# Patient Record
Sex: Female | Born: 1995 | Hispanic: Yes | Marital: Married | Smoking: Never smoker
Health system: Southern US, Community
[De-identification: ages and names within clinical notes are randomized; demographics above are authoritative.]

## PROBLEM LIST (undated history)

## (undated) ENCOUNTER — Inpatient Hospital Stay: Payer: Self-pay

## (undated) DIAGNOSIS — Z789 Other specified health status: Secondary | ICD-10-CM

## (undated) HISTORY — PX: NO PAST SURGERIES: SHX2092

---

## 2014-07-28 ENCOUNTER — Emergency Department: Payer: Self-pay | Admitting: Emergency Medicine

## 2014-07-28 LAB — URINALYSIS, COMPLETE
BILIRUBIN, UR: NEGATIVE
GLUCOSE, UR: NEGATIVE mg/dL (ref 0–75)
KETONE: NEGATIVE
NITRITE: NEGATIVE
Ph: 7 (ref 4.5–8.0)
Protein: 30
Specific Gravity: 1.013 (ref 1.003–1.030)
Squamous Epithelial: 2
WBC UR: 303 /HPF (ref 0–5)

## 2014-07-28 LAB — PREGNANCY, URINE: Pregnancy Test, Urine: NEGATIVE m[IU]/mL

## 2016-01-29 ENCOUNTER — Encounter: Payer: Self-pay | Admitting: Emergency Medicine

## 2016-01-29 ENCOUNTER — Emergency Department
Admission: EM | Admit: 2016-01-29 | Discharge: 2016-01-29 | Disposition: A | Discharge status: Home / Self Care | Payer: Managed Care, Other (non HMO) | Attending: Emergency Medicine | Admitting: Emergency Medicine

## 2016-01-29 DIAGNOSIS — B349 Viral infection, unspecified: Secondary | ICD-10-CM | POA: Insufficient documentation

## 2016-01-29 DIAGNOSIS — J029 Acute pharyngitis, unspecified: Secondary | ICD-10-CM | POA: Diagnosis present

## 2016-01-29 LAB — POCT RAPID STREP A: Streptococcus, Group A Screen (Direct): NEGATIVE

## 2016-01-29 NOTE — ED Provider Notes (Signed)
[facility] Emergency Department Provider Note  ____________________________________________  Time seen: Approximately 6:05 AM  I have reviewed the triage vital signs and the nursing notes.   HISTORY  Chief Complaint Sore Throat; Generalized Body Aches; and Nasal Congestion    HPI Diane Morse is a 20 y.o. female with no significant past medical history who presents with gradual onset of symptoms for 3 days that include a mild cough, sore throat, generalized body aches, nasal congestion, and full and painful ears bilaterally.  She reports that she has been taking ibuprofen but that nothing makes it better.  It is not necessarily getting worse over time, just staying the same.  She is able to eat and drink without difficulty, swallow with mild to moderate pain, and is speaking without any change in voice.  She has felt a subjective fever and occasional chills.  She denies chest pain, shortness of breath, nausea, vomiting, abdominal pain, diarrhea, dysuria.   History reviewed. No pertinent past medical history.  There are no active problems to display for this patient.   History reviewed. No pertinent past surgical history.  No current outpatient prescriptions on file.  Allergies Review of patient's allergies indicates no known allergies.  History reviewed. No pertinent family history.  Social History Social History  Substance Use Topics  . Smoking status: Never Smoker   . Smokeless tobacco: None  . Alcohol Use: No    Review of Systems Constitutional: Subjective fever/chills.  Generalized body aches Eyes: No visual changes. ENT: +sore throat. Cardiovascular: Denies chest pain. Respiratory: Denies shortness of breath. Gastrointestinal: No abdominal pain.  No nausea, no vomiting.  No diarrhea.  No constipation. Genitourinary: Negative for dysuria. Musculoskeletal: Negative for back pain. Skin: Negative for rash. Neurological: Negative for  headaches, focal weakness or numbness.  10-point ROS otherwise negative.  ____________________________________________   PHYSICAL EXAM:  VITAL SIGNS: ED Triage Vitals  Enc Vitals Group     BP 01/29/16 0223 121/82 mmHg     Pulse Rate 01/29/16 0223 83     Resp 01/29/16 0223 18     Temp 01/29/16 0223 98.2 F (36.8 C)     Temp Source 01/29/16 0223 Oral     SpO2 01/29/16 0223 99 %     Weight 01/29/16 0223 121 lb 1 oz (54.914 kg)     Height 01/29/16 0223  (1.575 m)     Head Cir --      Peak Flow --      Pain Score 01/29/16 0224 8     Pain Loc --      Pain Edu? --      Excl. in GC? --     Constitutional: Alert and oriented. Well appearing and in no acute distress. Multiple voice. Eyes: Conjunctivae are normal. PERRL. EOMI. Head: Atraumatic. Nose: +congestion/rhinnorhea. Mouth/Throat: Mucous membranes are moist.  Sclera oropharynx is erythematous but without petechiae and no exudate on the tonsils.  Her tonsils are not swollen.  There is no sign of intraoral or pharyngeal abscess.  No hoarse nor "hot potato" voice. Neck: No stridor.  No meningeal signs.  Mild cervical lymphadenopathy with some tenderness.  No tenderness with manipulation of the larynx. Cardiovascular: Normal rate, regular rhythm. Good peripheral circulation. Grossly normal heart sounds.   Respiratory: Normal respiratory effort.  No retractions. Lungs CTAB. Gastrointestinal: Soft and nontender. No distention.  Musculoskeletal: No lower extremity tenderness nor edema. No gross deformities of extremities. Neurologic:  Normal speech and language. No gross  focal neurologic deficits are appreciated.  Skin:  Skin is warm, dry and intact. No rash noted. Psychiatric: Mood and affect are normal. Speech and behavior are normal.  ____________________________________________   LABS (all labs ordered are listed, but only abnormal results are displayed)  Labs Reviewed  POCT RAPID STREP A    ____________________________________________  EKG  None ____________________________________________  RADIOLOGY   No results found.  ____________________________________________   PROCEDURES  Procedure(s) performed: None  Critical Care performed: No ____________________________________________   INITIAL IMPRESSION / ASSESSMENT AND PLAN / ED COURSE  Pertinent labs & imaging results that were available during my care of the patient were reviewed by me and considered in my medical decision making (see chart for details).  Signs/sxs suggest viral illness.   Nothing to suggest serious bacterial infection.  Gave usual/customary recommendations and return precautions.   ____________________________________________  FINAL CLINICAL IMPRESSION(S) / ED DIAGNOSES  Final diagnoses:  Viral syndrome     MEDICATIONS GIVEN DURING THIS VISIT:  Medications - No data to display   NEW OUTPATIENT MEDICATIONS STARTED DURING THIS VISIT:  New Prescriptions   No medications on file      Note:  This document was prepared using Dragon voice recognition software and may include unintentional dictation errors.   Loleta Roseory Nevah Dalal, MD 01/29/16 [phone]

## 2016-01-29 NOTE — ED Notes (Signed)
Pt reports cough, sore throat and general body aches for 3 days; taking Ibuprofen with no relief; pt talking in complete coherent sentences; in no acute distress

## 2016-01-29 NOTE — Discharge Instructions (Signed)

## 2018-02-13 ENCOUNTER — Encounter: Payer: Self-pay | Admitting: Emergency Medicine

## 2018-02-13 ENCOUNTER — Emergency Department: Payer: Managed Care, Other (non HMO)

## 2018-02-13 ENCOUNTER — Other Ambulatory Visit: Payer: Self-pay

## 2018-02-13 ENCOUNTER — Emergency Department
Admission: EM | Admit: 2018-02-13 | Discharge: 2018-02-13 | Disposition: A | Discharge status: Home / Self Care | Payer: Managed Care, Other (non HMO) | Attending: Emergency Medicine | Admitting: Emergency Medicine

## 2018-02-13 DIAGNOSIS — N12 Tubulo-interstitial nephritis, not specified as acute or chronic: Secondary | ICD-10-CM | POA: Diagnosis not present

## 2018-02-13 DIAGNOSIS — R1031 Right lower quadrant pain: Secondary | ICD-10-CM | POA: Diagnosis present

## 2018-02-13 DIAGNOSIS — N390 Urinary tract infection, site not specified: Secondary | ICD-10-CM | POA: Diagnosis not present

## 2018-02-13 LAB — URINALYSIS, COMPLETE (UACMP) WITH MICROSCOPIC
Bacteria, UA: NONE SEEN
Bilirubin Urine: NEGATIVE
GLUCOSE, UA: NEGATIVE mg/dL
Ketones, ur: NEGATIVE mg/dL
Nitrite: NEGATIVE
PH: 7 (ref 5.0–8.0)
Protein, ur: 30 mg/dL — AB
SPECIFIC GRAVITY, URINE: 1.011 (ref 1.005–1.030)
WBC, UA: >50 WBC/hpf — ABNORMAL HIGH (ref 0–5)

## 2018-02-13 LAB — CBC
HCT: 37.5 % (ref 35.0–47.0)
Hemoglobin: 12.8 g/dL (ref 12.0–16.0)
MCH: 28.9 pg (ref 26.0–34.0)
MCHC: 34.1 g/dL (ref 32.0–36.0)
MCV: 84.7 fL (ref 80.0–100.0)
PLATELETS: 262 10*3/uL (ref 150–440)
RBC: 4.43 MIL/uL (ref 3.80–5.20)
RDW: 11.9 % (ref 11.5–14.5)
WBC: 12.1 10*3/uL — AB (ref 3.6–11.0)

## 2018-02-13 LAB — COMPREHENSIVE METABOLIC PANEL
ALK PHOS: 89 U/L (ref 38–126)
ALT: 26 U/L (ref 14–54)
AST: 32 U/L (ref 15–41)
Albumin: 4.1 g/dL (ref 3.5–5.0)
Anion gap: 7 (ref 5–15)
BUN: 9 mg/dL (ref 6–20)
CALCIUM: 8.8 mg/dL — AB (ref 8.9–10.3)
CO2: 24 mmol/L (ref 22–32)
CREATININE: 0.64 mg/dL (ref 0.44–1.00)
Chloride: 105 mmol/L (ref 101–111)
Glucose, Bld: 121 mg/dL — ABNORMAL HIGH (ref 65–99)
Potassium: 3.3 mmol/L — ABNORMAL LOW (ref 3.5–5.1)
Sodium: 136 mmol/L (ref 135–145)
Total Bilirubin: 0.3 mg/dL (ref 0.3–1.2)
Total Protein: 7.2 g/dL (ref 6.5–8.1)

## 2018-02-13 LAB — POCT PREGNANCY, URINE: Preg Test, Ur: NEGATIVE

## 2018-02-13 LAB — LIPASE, BLOOD: LIPASE: 40 U/L (ref 11–51)

## 2018-02-13 MED ORDER — SODIUM CHLORIDE 0.9 % IV BOLUS
1000.0000 mL | Freq: Once | INTRAVENOUS | Status: AC
  Administered 2018-02-13: 1000 mL via INTRAVENOUS

## 2018-02-13 MED ORDER — ONDANSETRON 4 MG PO TBDP: 4.0000 mg | ORAL_TABLET | Freq: Three times a day (TID) | ORAL | 0 refills | Status: DC | PRN

## 2018-02-13 MED ORDER — ONDANSETRON HCL 4 MG/2ML IJ SOLN
4.0000 mg | Freq: Once | INTRAMUSCULAR | Status: AC
  Administered 2018-02-13: 4 mg via INTRAVENOUS
  Filled 2018-02-13: qty 2

## 2018-02-13 MED ORDER — HYDROCODONE-ACETAMINOPHEN 5-325 MG PO TABS: 1.0000 | ORAL_TABLET | Freq: Four times a day (QID) | ORAL | 0 refills | Status: DC | PRN

## 2018-02-13 MED ORDER — KETOROLAC TROMETHAMINE 30 MG/ML IJ SOLN
10.0000 mg | Freq: Once | INTRAMUSCULAR | Status: AC
  Administered 2018-02-13: 9.9 mg via INTRAVENOUS
  Filled 2018-02-13: qty 1

## 2018-02-13 MED ORDER — IBUPROFEN 600 MG PO TABS: 600.0000 mg | ORAL_TABLET | Freq: Three times a day (TID) | ORAL | 0 refills | Status: DC | PRN

## 2018-02-13 MED ORDER — SODIUM CHLORIDE 0.9 % IV SOLN
1.0000 g | Freq: Once | INTRAVENOUS | Status: AC
  Administered 2018-02-13: 1 g via INTRAVENOUS
  Filled 2018-02-13: qty 10

## 2018-02-13 MED ORDER — CEPHALEXIN 500 MG PO CAPS: 500.0000 mg | ORAL_CAPSULE | Freq: Three times a day (TID) | ORAL | 0 refills | Status: DC

## 2018-02-13 NOTE — ED Triage Notes (Signed)
Patient ambulatory to triage with steady gait, without difficulty or distress noted; pt reports right sided abd pain radiating into back today accomp by dysuria

## 2018-02-13 NOTE — Discharge Instructions (Addendum)
1.  Take antibiotic as prescribed (Keflex 500 mg 3 times daily for 7 days). 2.  You may take medicines as needed for pain or nausea (Motrin/Norco/Zofran #15). 3.  Return to the ER for worsening symptoms, persistent vomiting, fever or other concerns.

## 2018-02-13 NOTE — ED Provider Notes (Signed)
[facility] Emergency Department Provider Note   ____________________________________________   First MD Initiated Contact with Patient 02/13/18 0136     (approximate)  I have reviewed the triage vital signs and the nursing notes.   HISTORY  Chief Complaint Abdominal Pain    HPI Diane Morse is a 22 y.o. female who presents to the ED from home with a chief complaint of dysuria, right-sided abdominal pain radiating into her back.  Symptoms ongoing for the past 3 days.  Intermittent nausea.  She was in [place] and unable to get any medications.  [place] yesterday and started taking AZO which has improved her dysuria.  Denies associated fever, chills, chest pain, shortness of breath, vomiting, diarrhea.   Past medical history None  There are no active problems to display for this patient.   History reviewed. No pertinent surgical history.  Prior to Admission medications   Not on File    Allergies Patient has no known allergies.  No family history on file.  Social History Social History   Tobacco Use  . Smoking status: Never Smoker  . Smokeless tobacco: Never Used  Substance Use Topics  . Alcohol use: No  . Drug use: No    Review of Systems  Constitutional: No fever/chills Eyes: No visual changes. ENT: No sore throat. Cardiovascular: Denies chest pain. Respiratory: Denies shortness of breath. Gastrointestinal: Positive for abdominal pain.  Positive for nausea, no vomiting.  No diarrhea.  No constipation. Genitourinary: Positive for dysuria. Musculoskeletal: Positive for back pain. Skin: Negative for rash. Neurological: Negative for headaches, focal weakness or numbness.   ____________________________________________   PHYSICAL EXAM:  VITAL SIGNS: ED Triage Vitals  Enc Vitals Group     BP 02/13/18 0020 116/78     Pulse Rate 02/13/18 0020 (!) 104     Resp 02/13/18 0020 (!) 22     Temp 02/13/18 0020  98.8 F (37.1 C)     Temp Source 02/13/18 0020 Oral     SpO2 02/13/18 0020 100 %     Weight 02/13/18 0002 125 lb (56.7 kg)     Height 02/13/18 0002 5\' 2"  (1.575 m)     Head Circumference --      Peak Flow --      Pain Score 02/13/18 0002 10     Pain Loc --      Pain Edu? --      Excl. in GC? --     Constitutional: Alert and oriented. Well appearing and in mild acute distress. Eyes: Conjunctivae are normal. PERRL. EOMI. Head: Atraumatic. Nose: No congestion/rhinnorhea. Mouth/Throat: Mucous membranes are moist.  Oropharynx non-erythematous. Neck: No stridor.   Cardiovascular: Normal rate, regular rhythm. Grossly normal heart sounds.  Good peripheral circulation. Respiratory: Normal respiratory effort.  No retractions. Lungs CTAB. Gastrointestinal: Soft and mildly tender to palpation suprapubic area without rebound or guarding. No distention. No abdominal bruits.  Mild right CVA tenderness. Musculoskeletal: No lower extremity tenderness nor edema.  No joint effusions. Neurologic:  Normal speech and language. No gross focal neurologic deficits are appreciated. No gait instability. Skin:  Skin is warm, dry and intact. No rash noted. Psychiatric: Mood and affect are normal. Speech and behavior are normal.  ____________________________________________   LABS (all labs ordered are listed, but only abnormal results are displayed)  Labs Reviewed  COMPREHENSIVE METABOLIC PANEL - Abnormal; Notable for the following components:      Result Value   Potassium 3.3 (*)  Glucose, Bld 121 (*)    Calcium 8.8 (*)    All other components within normal limits  CBC - Abnormal; Notable for the following components:   WBC 12.1 (*)    All other components within normal limits  URINALYSIS, COMPLETE (UACMP) WITH MICROSCOPIC - Abnormal; Notable for the following components:   Color, Urine YELLOW (*)    APPearance HAZY (*)    Hgb urine dipstick MODERATE (*)    Protein, ur 30 (*)    Leukocytes, UA  MODERATE (*)    WBC, UA >50 (*)    All other components within normal limits  URINE CULTURE  LIPASE, BLOOD  POC URINE PREG, ED  POCT PREGNANCY, URINE   ____________________________________________  EKG  None ____________________________________________  RADIOLOGY  ED MD interpretation: Urethritis, pyelonephritis; no obstructing stones  Official radiology report(s): Ct Renal Stone Study  Result Date: 02/13/2018 CLINICAL DATA:  Acute onset of right-sided abdominal pain, radiating to the back. Dysuria. EXAM: CT ABDOMEN AND PELVIS WITHOUT CONTRAST TECHNIQUE: Multidetector CT imaging of the abdomen and pelvis was performed following the standard protocol without IV contrast. COMPARISON:  None. FINDINGS: Lower chest: The visualized lung bases are grossly clear. The visualized portions of the mediastinum are unremarkable. Hepatobiliary: The liver is unremarkable in appearance. The gallbladder is unremarkable in appearance. The common bile duct remains normal in caliber. Pancreas: The pancreas is within normal limits. Spleen: The spleen is unremarkable in appearance. Adrenals/Urinary Tract: The adrenal glands are unremarkable in appearance. Minimal haziness is noted about the right renal pelvis and right ureter, concerning for right-sided ureteritis and pyelonephritis. There is no evidence of hydronephrosis. No renal or ureteral stones are identified. No perinephric stranding is seen. Stomach/Bowel: The stomach is unremarkable in appearance. The small bowel is within normal limits. The appendix is normal in caliber, without evidence of appendicitis. The colon is unremarkable in appearance. Vascular/Lymphatic: The abdominal aorta is unremarkable in appearance. The inferior vena cava is grossly unremarkable. No retroperitoneal lymphadenopathy is seen. No pelvic sidewall lymphadenopathy is identified. Reproductive: The bladder is thick-walled, concerning for cystitis. The uterus is grossly unremarkable.  The ovaries are relatively symmetric. No suspicious adnexal masses are seen. Other: No additional soft tissue abnormalities are seen. Musculoskeletal: No acute osseous abnormalities are identified. The visualized musculature is unremarkable in appearance. IMPRESSION: 1. Minimal haziness about the right renal pelvis and right ureter, concerning for right-sided ureteritis and pyelonephritis. 2. Thick-walled bladder, concerning for cystitis. Electronically Signed   By: Roanna Raider M.D.   On: 02/13/2018 02:11    ____________________________________________   PROCEDURES  Procedure(s) performed: None  Procedures  Critical Care performed: No  ____________________________________________   INITIAL IMPRESSION / ASSESSMENT AND PLAN / ED COURSE  As part of my medical decision making, I reviewed the following data within the electronic MEDICAL RECORD NUMBER History obtained from family, Nursing notes reviewed and incorporated, Labs reviewed, Radiograph reviewed and Notes from prior ED visits   22 year old female who presents with dysuria, abdominal and flank pain. Differential diagnosis includes, but is not limited to, ovarian cyst, ovarian torsion, acute appendicitis, diverticulitis, urinary tract infection/pyelonephritis, endometriosis, bowel obstruction, colitis, renal colic, gastroenteritis, hernia, fibroids, endometriosis, pregnancy related pain including ectopic pregnancy, etc.   Patient is afebrile, hemodynamically stable, with mild leukocytosis and urinalysis consistent with UTI.  Very low suspicion for sepsis.  Clinically patient's symptoms are consistent with pyelonephritis.  Although she has no personal history of kidney stones, will obtain CT scan to evaluate obstructive uropathy.  Will initiate IV  fluid resuscitation, 10 mg IV Toradol for pain, 4 mg IV Zofran for nausea.  Start 1 g IV Rocephin and add urine culture.      ____________________________________________   FINAL CLINICAL  IMPRESSION(S) / ED DIAGNOSES  Final diagnoses:  Lower urinary tract infectious disease  Pyelonephritis     ED Discharge Orders    None       Note:  This document was prepared using Dragon voice recognition software and may include unintentional dictation errors.    Irean HongSung, Jade J, MD 02/13/18 [phone]

## 2018-02-15 LAB — URINE CULTURE: Culture: >=100000 — AB

## 2019-07-22 IMAGING — CT CT RENAL STONE PROTOCOL
3 of 4 series · 7 of 46 positions shown, 13 images · non-contrast
Comparison: None.

CLINICAL DATA: Acute onset of right-sided abdominal pain, radiating
to the back. Dysuria.

EXAM:
CT ABDOMEN AND PELVIS WITHOUT CONTRAST
TECHNIQUE: Multidetector CT imaging of the abdomen and pelvis was performed
following the standard protocol without IV contrast.

[Series 4: lung bases · axial · 0.63mm/px · z∈[-382,-342]mm · 3 of 17 slices shown, 7 images]
[im 5/17  soft-tissue]
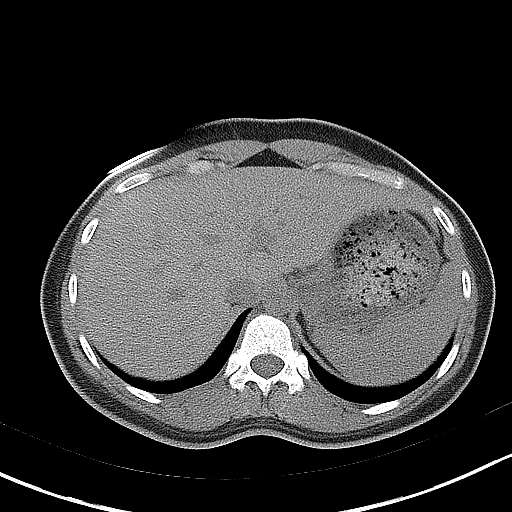
[im 5/17  lung]
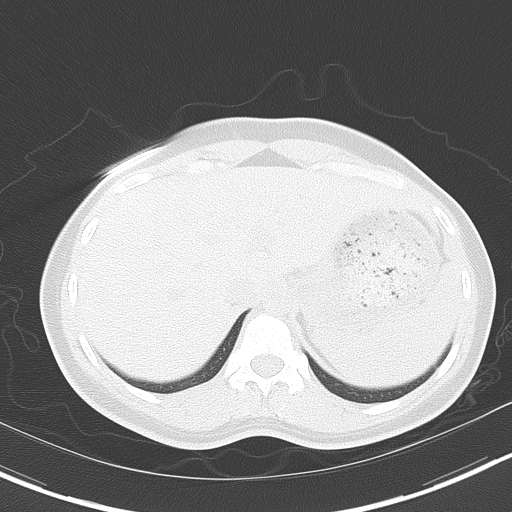
[im 5/17  bone]
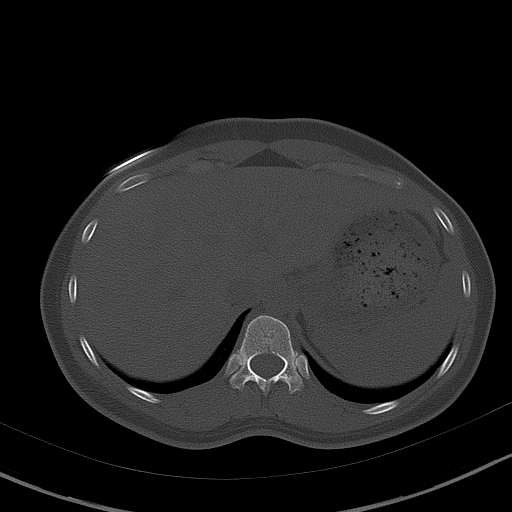
[im 9/17  soft-tissue]
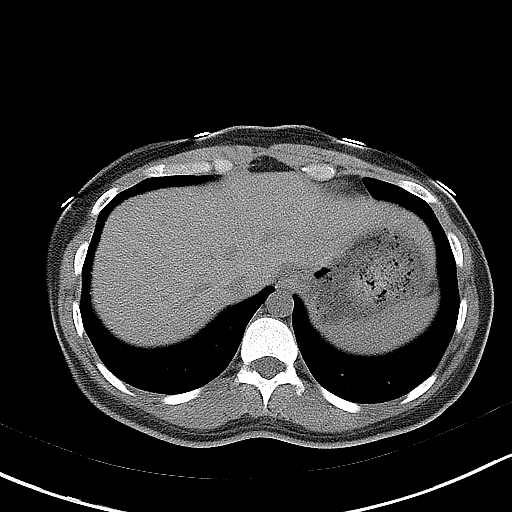
[im 9/17  lung]
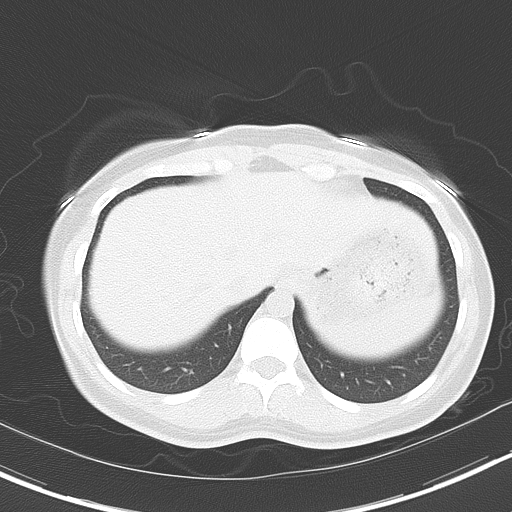
[im 13/17  soft-tissue]
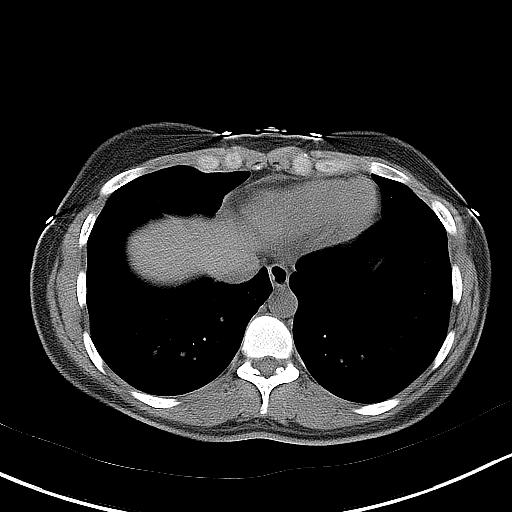
[im 13/17  lung]
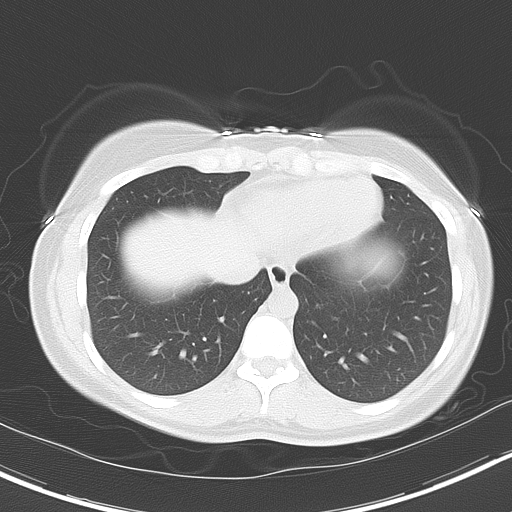

[Series 5: coronal · coronal · 0.62mm/px · 3 of 107 slices shown, 4 images]
[im 36/107  soft-tissue]
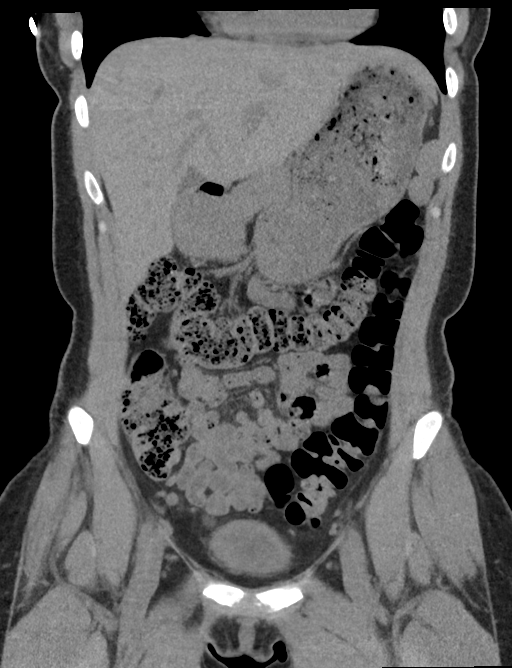
[im 48/107  soft-tissue]
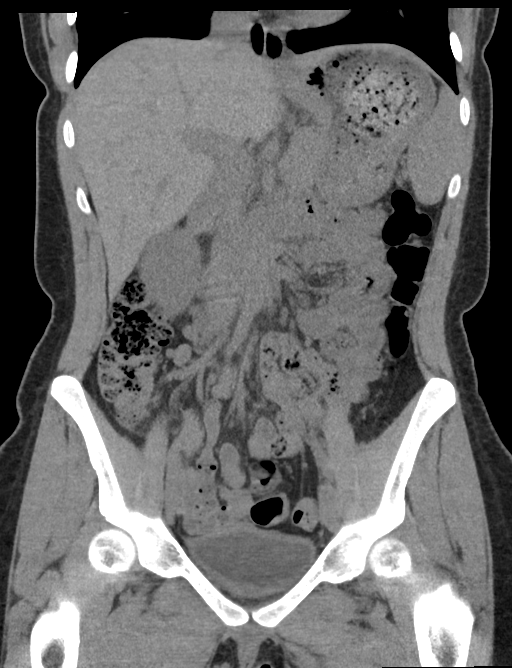
[im 48/107  bone]
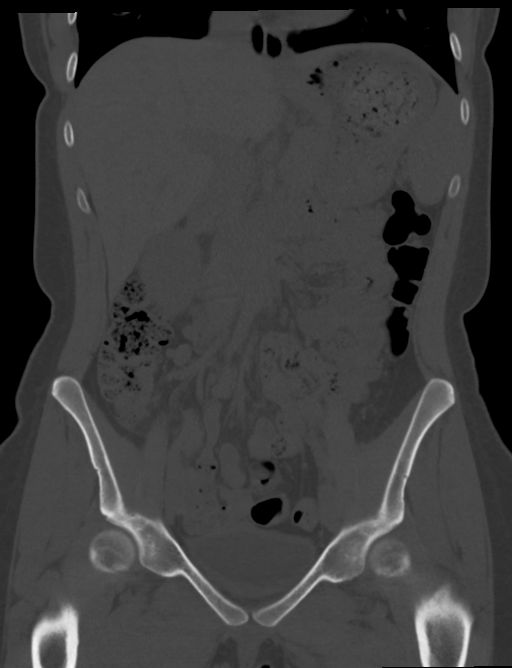
[im 59/107  soft-tissue]
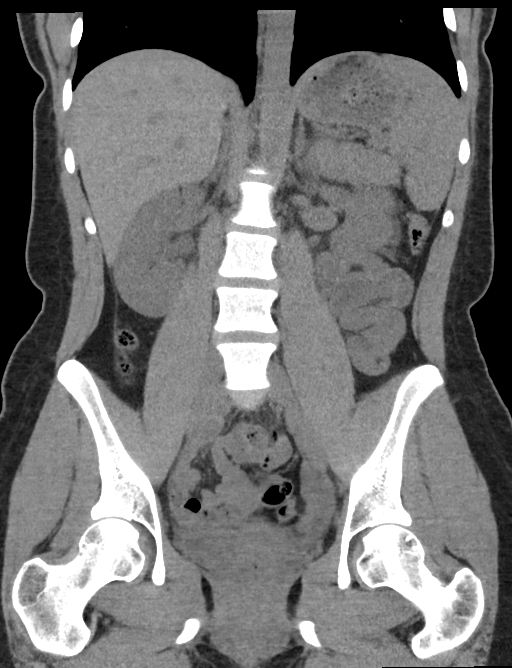

[Series 6: sagittal · sagittal · 0.48mm/px · 1 of 155 slices shown, 2 images]
[im 52/155  soft-tissue]
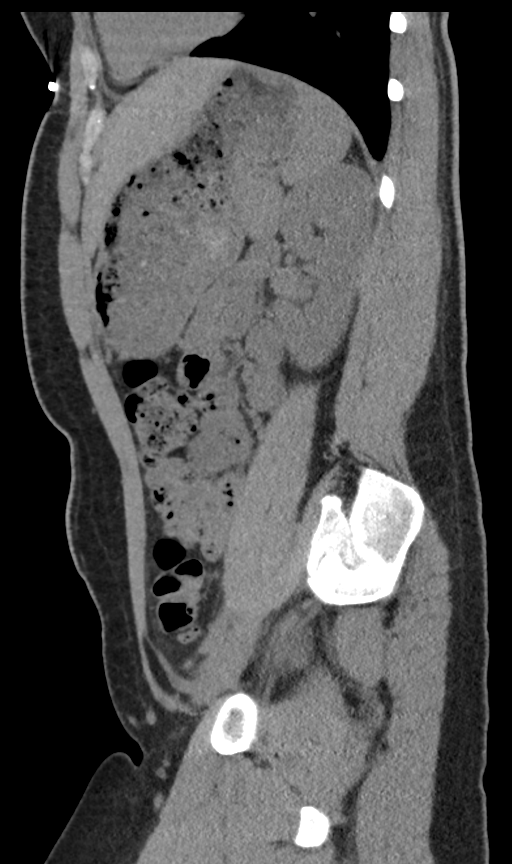
[im 52/155  bone]
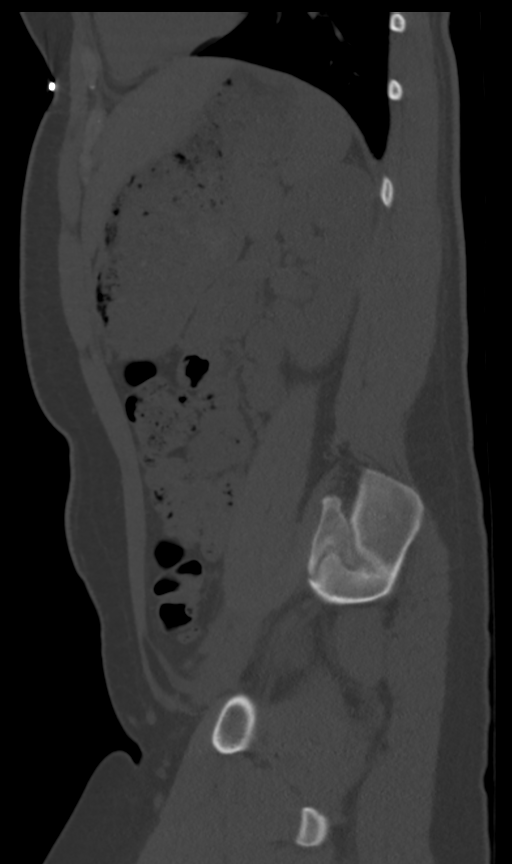

[7 of 46 positions shown; findings below may reference images not displayed]

FINDINGS: Lower chest: The visualized lung bases are grossly clear. The
visualized portions of the mediastinum are unremarkable.

Hepatobiliary: The liver is unremarkable in appearance. The
gallbladder is unremarkable in appearance. The common bile duct
remains normal in caliber.

Pancreas: The pancreas is within normal limits.

Spleen: The spleen is unremarkable in appearance.

Adrenals/Urinary Tract: The adrenal glands are unremarkable in
appearance.

Minimal haziness is noted about the right renal pelvis and right
ureter, concerning for right-sided ureteritis and pyelonephritis.
There is no evidence of hydronephrosis. No renal or ureteral stones
are identified. No perinephric stranding is seen.

Stomach/Bowel: The stomach is unremarkable in appearance. The small
bowel is within normal limits. The appendix is normal in caliber,
without evidence of appendicitis. The colon is unremarkable in
appearance.

Vascular/Lymphatic: The abdominal aorta is unremarkable in
appearance. The inferior vena cava is grossly unremarkable. No
retroperitoneal lymphadenopathy is seen. No pelvic sidewall
lymphadenopathy is identified.

Reproductive: The bladder is thick-walled, concerning for cystitis.
The uterus is grossly unremarkable. The ovaries are relatively
symmetric. No suspicious adnexal masses are seen.

Other: No additional soft tissue abnormalities are seen.

Musculoskeletal: No acute osseous abnormalities are identified. The
visualized musculature is unremarkable in appearance.
IMPRESSION: 1. Minimal haziness about the right renal pelvis and right ureter,
concerning for right-sided ureteritis and pyelonephritis.
2. Thick-walled bladder, concerning for cystitis.

## 2019-12-06 ENCOUNTER — Ambulatory Visit: Payer: Self-pay | Attending: Internal Medicine

## 2019-12-06 DIAGNOSIS — Z23 Encounter for immunization: Secondary | ICD-10-CM

## 2019-12-06 NOTE — Progress Notes (Signed)
   Covid-19 Vaccination Clinic  Name:  Kayron Kalmar    MRN: [id number] DOB: Feb 03, 1996  12/06/2019  Ms. Stuck was observed post Covid-19 immunization for 15 minutes without incident. She was provided with Vaccine Information Sheet and instruction to access the V-Safe system.   Ms. Romanek was instructed to call 911 with any severe reactions post vaccine: Marland Kitchen Difficulty breathing  . Swelling of face and throat  . A fast heartbeat  . A bad rash all over body  . Dizziness and weakness   Immunizations Administered    Name Date Dose VIS Date Route   Pfizer COVID-19 Vaccine 12/06/2019  7:09 PM 0.3 mL 08/21/2019 Intramuscular   Manufacturer: [employer], [employer]   Lot: [id number]   NDC: [address]-0

## 2019-12-27 ENCOUNTER — Ambulatory Visit: Payer: Medicaid Other | Attending: Internal Medicine

## 2019-12-27 DIAGNOSIS — Z23 Encounter for immunization: Secondary | ICD-10-CM

## 2019-12-27 NOTE — Progress Notes (Signed)
   Covid-19 Vaccination Clinic  Name:  Dao Mearns    MRN: [id number] DOB: 1996-04-08  12/27/2019  Ms. Boston was observed post Covid-19 immunization for 15 minutes without incident. She was provided with Vaccine Information Sheet and instruction to access the V-Safe system.   Ms. Newby was instructed to call 911 with any severe reactions post vaccine: Marland Kitchen Difficulty breathing  . Swelling of face and throat  . A fast heartbeat  . A bad rash all over body  . Dizziness and weakness   Immunizations Administered    Name Date Dose VIS Date Route   Pfizer COVID-19 Vaccine 12/27/2019  6:48 PM 0.3 mL 11/04/2018 Intramuscular   Manufacturer: [employer], [employer]   Lot: [id number]   NDC: [address]-8

## 2020-08-15 ENCOUNTER — Other Ambulatory Visit: Payer: Self-pay

## 2020-08-15 ENCOUNTER — Emergency Department
Admission: EM | Admit: 2020-08-15 | Discharge: 2020-08-15 | Disposition: A | Discharge status: Home / Self Care | Payer: Medicaid Other | Attending: Emergency Medicine | Admitting: Emergency Medicine

## 2020-08-15 DIAGNOSIS — Z3A01 Less than 8 weeks gestation of pregnancy: Secondary | ICD-10-CM | POA: Insufficient documentation

## 2020-08-15 DIAGNOSIS — O26891 Other specified pregnancy related conditions, first trimester: Secondary | ICD-10-CM | POA: Insufficient documentation

## 2020-08-15 DIAGNOSIS — Z5321 Procedure and treatment not carried out due to patient leaving prior to being seen by health care provider: Secondary | ICD-10-CM | POA: Insufficient documentation

## 2020-08-15 DIAGNOSIS — R1030 Lower abdominal pain, unspecified: Secondary | ICD-10-CM | POA: Insufficient documentation

## 2020-08-15 NOTE — ED Triage Notes (Signed)
Pt here with lower groin pain that started last night. Pain starts in the front and travels around to her back. Pt is currently [redacted] weeks pregnant with her first child. Pt NAD in triage.

## 2020-08-15 NOTE — ED Notes (Signed)
Attempted to get vitals.  Pt does not want to wait any longer and will go to another emergency dept.  Pt ambulated out the door

## 2020-08-16 LAB — OB RESULTS CONSOLE GC/CHLAMYDIA
Chlamydia: NEGATIVE
Gonorrhea: NEGATIVE

## 2020-09-10 NOTE — L&D Delivery Note (Signed)
Pt transported to nursery at this time.

## 2020-09-10 NOTE — L&D Delivery Note (Signed)
This Rn brought breast pump and set to pt in order to let pt pump. Pt asked if she would like to pump at this time and pt stated "not right now". Pt preparing to rest at this time.

## 2020-09-10 NOTE — L&D Delivery Note (Signed)
Delivery Note  First Stage: Labor onset: 1802 Induction: Cytotec x 5 doses (starting 02/16/21 at 0046), Cook Catheter, Pitocin and AROM Analgesia /Anesthesia intrapartum: epidural AROM at 1802  Second Stage: Complete dilation at 2122 Onset of pushing at 2127 FHR second stage Cat II- min-mod variability  Delivery of a viable female infant on 02/17/21 at 2141 by CNM delivery of fetal head in LOA position with restitution to LOT. No nuchal cord;  Anterior then posterior shoulders delivered easily with gentle downward traction. Baby vigorous at perineum, cord double clamped and cut by CNM and handed off to waiting NICU team. Cord blood sample collected    Third Stage: Placenta delivered spontaneously intact with [facility] @ 2145 Placenta disposition: to pathology due to severe pre-E at 34.4wks Uterine tone firmed with massage/ bleeding small  1st deg perineal and left sidewall laceration identified  Anesthesia for repair: epidural Repair 2-0 Vicryl CT-1 in usual fashion to achieve hemostasis.  Est. Blood Loss (mL): 305  Complications: none  Mom to  remain in L&D on Magnesium for 24hrs PP .  Baby to Couplet care / Skin to Skin.  Newborn: Birth Weight: 4#9  Apgar Scores: 8/9 Feeding planned: breast

## 2020-10-07 LAB — OB RESULTS CONSOLE VARICELLA ZOSTER ANTIBODY, IGG: Varicella: IMMUNE

## 2020-10-07 LAB — OB RESULTS CONSOLE HIV ANTIBODY (ROUTINE TESTING): HIV: NONREACTIVE

## 2020-10-07 LAB — OB RESULTS CONSOLE RPR: RPR: NONREACTIVE

## 2020-10-07 LAB — OB RESULTS CONSOLE HEPATITIS B SURFACE ANTIGEN: Hepatitis B Surface Ag: NEGATIVE

## 2020-10-07 LAB — OB RESULTS CONSOLE RUBELLA ANTIBODY, IGM: Rubella: NON-IMMUNE/NOT IMMUNE

## 2021-02-15 ENCOUNTER — Inpatient Hospital Stay
Admission: EM | Admit: 2021-02-15 | Discharge: 2021-02-19 | DRG: 806 | Disposition: A | Discharge status: Home / Self Care | Payer: Medicaid Other | Attending: Obstetrics and Gynecology | Admitting: Obstetrics and Gynecology

## 2021-02-15 ENCOUNTER — Other Ambulatory Visit: Payer: Self-pay

## 2021-02-15 ENCOUNTER — Encounter: Payer: Self-pay | Admitting: Obstetrics and Gynecology

## 2021-02-15 DIAGNOSIS — Z3A34 34 weeks gestation of pregnancy: Secondary | ICD-10-CM

## 2021-02-15 DIAGNOSIS — O320XX0 Maternal care for unstable lie, not applicable or unspecified: Secondary | ICD-10-CM

## 2021-02-15 DIAGNOSIS — Z20822 Contact with and (suspected) exposure to covid-19: Secondary | ICD-10-CM | POA: Diagnosis present

## 2021-02-15 DIAGNOSIS — O1414 Severe pre-eclampsia complicating childbirth: Principal | ICD-10-CM | POA: Diagnosis present

## 2021-02-15 DIAGNOSIS — D62 Acute posthemorrhagic anemia: Secondary | ICD-10-CM | POA: Diagnosis not present

## 2021-02-15 DIAGNOSIS — O9081 Anemia of the puerperium: Secondary | ICD-10-CM | POA: Diagnosis not present

## 2021-02-15 DIAGNOSIS — O1490 Unspecified pre-eclampsia, unspecified trimester: Secondary | ICD-10-CM | POA: Diagnosis present

## 2021-02-15 DIAGNOSIS — Z8616 Personal history of COVID-19: Secondary | ICD-10-CM

## 2021-02-15 HISTORY — DX: Other specified health status: Z78.9

## 2021-02-15 LAB — PROTEIN / CREATININE RATIO, URINE
Creatinine, Urine: 105 mg/dL
Protein Creatinine Ratio: 1.36 mg/mg{Cre} — ABNORMAL HIGH (ref 0.00–0.15)
Total Protein, Urine: 143 mg/dL

## 2021-02-15 LAB — CBC
HCT: 31.7 % — ABNORMAL LOW (ref 36.0–46.0)
Hemoglobin: 10.8 g/dL — ABNORMAL LOW (ref 12.0–15.0)
MCH: 27.5 pg (ref 26.0–34.0)
MCHC: 34.1 g/dL (ref 30.0–36.0)
MCV: 80.7 fL (ref 80.0–100.0)
Platelets: 233 10*3/uL (ref 150–400)
RBC: 3.93 MIL/uL (ref 3.87–5.11)
RDW: 12.9 % (ref 11.5–15.5)
WBC: 13.2 10*3/uL — ABNORMAL HIGH (ref 4.0–10.5)
nRBC: 0 % (ref 0.0–0.2)

## 2021-02-15 LAB — COMPREHENSIVE METABOLIC PANEL
ALT: 15 U/L (ref 0–44)
AST: 21 U/L (ref 15–41)
Albumin: 2.9 g/dL — ABNORMAL LOW (ref 3.5–5.0)
Alkaline Phosphatase: 119 U/L (ref 38–126)
Anion gap: 8 (ref 5–15)
BUN: 9 mg/dL (ref 6–20)
CO2: 20 mmol/L — ABNORMAL LOW (ref 22–32)
Calcium: 9.1 mg/dL (ref 8.9–10.3)
Chloride: 110 mmol/L (ref 98–111)
Creatinine, Ser: 0.44 mg/dL (ref 0.44–1.00)
GFR, Estimated: >60 mL/min (ref >60)
Glucose, Bld: 89 mg/dL (ref 70–99)
Potassium: 3.5 mmol/L (ref 3.5–5.1)
Sodium: 138 mmol/L (ref 135–145)
Total Bilirubin: 0.6 mg/dL (ref 0.3–1.2)
Total Protein: 6.3 g/dL — ABNORMAL LOW (ref 6.5–8.1)

## 2021-02-15 MED ORDER — BUTALBITAL-APAP-CAFFEINE 50-325-40 MG PO TABS
1.0000 | ORAL_TABLET | ORAL | Status: DC | PRN
  Administered 2021-02-15 – 2021-02-16 (×3): 1 via ORAL
  Filled 2021-02-15 (×3): qty 1

## 2021-02-15 MED ORDER — ONDANSETRON 4 MG PO TBDP
4.0000 mg | ORAL_TABLET | Freq: Three times a day (TID) | ORAL | Status: DC | PRN
  Administered 2021-02-15: 4 mg via ORAL
  Filled 2021-02-15: qty 1

## 2021-02-15 NOTE — H&P (Signed)
OB History & Physical   History of Present Illness:  Chief Complaint:   HPI:  Diane Morse is a 25 y.o. G1P0 female at [redacted]w[redacted]d dated by LMP of 06/20/20.  She presents to L&D for pre-eclampsia with severe features.  She reports:  -active fetal movement -no leakage of fluid -no vaginal bleeding -no contractions  Pregnancy Issues: 1. Uterine S<D 2. Abnormal AFP 3. Rubella non-immune 4. Undiagnosed depression/anxiety 5. Covid-19 infection in pregnancy  8. Migraines    Maternal Medical History:   Past Medical History:  Diagnosis Date  . Medical history non-contributory     Past Surgical History:  Procedure Laterality Date  . NO PAST SURGERIES      No Known Allergies  Prior to Admission medications   Medication Sig Start Date End Date Taking? Authorizing Provider  acetaminophen (TYLENOL) 325 MG tablet Take 650 mg by mouth every 6 (six) hours as needed.   Yes [provider]  ondansetron (ZOFRAN ODT) 4 MG disintegrating tablet Take 1 tablet (4 mg total) by mouth every 8 (eight) hours as needed for nausea or vomiting. 02/13/18  Yes Irean Hong, MD  cephALEXin (KEFLEX) 500 MG capsule Take 1 capsule (500 mg total) by mouth 3 (three) times daily. 02/13/18   Irean Hong, MD  HYDROcodone-acetaminophen (NORCO) 5-325 MG tablet Take 1 tablet by mouth every 6 (six) hours as needed for moderate pain. 02/13/18   Irean Hong, MD  ibuprofen (ADVIL,MOTRIN) 600 MG tablet Take 1 tablet (600 mg total) by mouth every 8 (eight) hours as needed. 02/13/18   Irean Hong, MD     Prenatal care site: [facility]  Social History: She  reports that she has never smoked. She has never used smokeless tobacco. She reports that she does not drink alcohol and does not use drugs.  Family History: family history is not on file.   Review of Systems: A full review of systems was performed and negative except as noted in the HPI.    Physical Exam:  Vital Signs: BP (!) 156/98   Pulse 69    Temp 98 F (36.7 C) (Oral)   Resp 18   LMP 06/20/2020 (Exact Date)   General:   alert and cooperative  Skin:  normal  Neurologic:    Alert & oriented x 3  Lungs:   nl effort  Heart:   regular rate and rhythm  Abdomen:  soft, non-tender; bowel sounds normal; no masses,  no organomegaly  Extremities: : non-tender, symmetric, 2+ edema bilaterally.        EFW:  01/31/2021 - 1886g (40%)    Results for orders placed or performed during the hospital encounter of 02/15/21 (from the past 24 hour(s))  Protein / creatinine ratio, urine     Status: Abnormal   Collection Time: 02/15/21 10:25 PM  Result Value Ref Range   Creatinine, Urine 105 mg/dL   Total Protein, Urine 143 mg/dL   Protein Creatinine Ratio 1.36 (H) 0.00 - 0.15 mg/mg[Cre]  Comprehensive metabolic panel     Status: Abnormal   Collection Time: 02/15/21 10:44 PM  Result Value Ref Range   Sodium 138 135 - 145 mmol/L   Potassium 3.5 3.5 - 5.1 mmol/L   Chloride 110 98 - 111 mmol/L   CO2 20 (L) 22 - 32 mmol/L   Glucose, Bld 89 70 - 99 mg/dL   BUN 9 6 - 20 mg/dL   Creatinine, Ser 3.24 0.44 - 1.00 mg/dL  Calcium 9.1 8.9 - 10.3 mg/dL   Total Protein 6.3 (L) 6.5 - 8.1 g/dL   Albumin 2.9 (L) 3.5 - 5.0 g/dL   AST 21 15 - 41 U/L   ALT 15 0 - 44 U/L   Alkaline Phosphatase 119 38 - 126 U/L   Total Bilirubin 0.6 0.3 - 1.2 mg/dL   GFR, Estimated >76 >54 mL/min   Anion gap 8 5 - 15  CBC     Status: Abnormal   Collection Time: 02/15/21 10:44 PM  Result Value Ref Range   WBC 13.2 (H) 4.0 - 10.5 K/uL   RBC 3.93 3.87 - 5.11 MIL/uL   Hemoglobin 10.8 (L) 12.0 - 15.0 g/dL   HCT 65.0 (L) 35.4 - 65.6 %   MCV 80.7 80.0 - 100.0 fL   MCH 27.5 26.0 - 34.0 pg   MCHC 34.1 30.0 - 36.0 g/dL   RDW 81.2 75.1 - 70.0 %   Platelets 233 150 - 400 K/uL   nRBC 0.0 0.0 - 0.2 %    Pertinent Results:  Prenatal Labs: Blood type/Rh O pos  Antibody screen neg  Rubella Non-Immune  Varicella Immune  RPR NR  HBsAg Neg  HIV NR  GC neg  Chlamydia  neg  Genetic screening MaterniT21 negative, AFP pos  1 hour GTT 153  3 hour GTT F82, 111, 104, 102  GBS pending   FHT: FHR: 135 bpm, variability: moderate,  accelerations:  Present,  decelerations:  Absent Category/reactivity:  Category I TOCO: none SVE:   /   /   deferred until cytotec placement     Assessment:  Diane Morse is a 25 y.o. G1P0 female at [redacted]w[redacted]d with Pre-eclampsia with severe features.   Plan:  1. Admit to Labor & Delivery; consents reviewed and obtained  2. Pre-eclampsia with severe features - Magnesium sulfate - IOL - Repeat labs in am  3. Anticipated Preterm delivery - BMX - Neo consult  4. Fetal Well being  - Fetal Tracing: Cat I - GBS pending - Presentation: u/s ordered to confirmed    5. Routine OB: - Prenatal labs reviewed, as above - Rh pos - CBC & T&S on admit - Clear fluids, IVF  6. Induction of Labor -  Contractions by external toco in place -  Plan for induction with cytotec -  Plan for continuous fetal monitoring  -  Maternal pain control as desired: IVPM, nitrous, regional anesthesia - Anticipate vaginal delivery  7. Post Partum Planning: - Flu - none  - Tdap given 01/04/21  - Covid-19 - Both doses, 02/2020, Pfizer - Contraception Plan: condoms and NFP - Feeding Plan: Plans to pump and bottle feed breastmilk  Moise Friday, CNM 02/16/2021 12:31 AM

## 2021-02-15 NOTE — OB Triage Note (Signed)
Pt G1P0 [redacted]w[redacted]d ([redacted]w[redacted]d by pt stating due date date 03/27/21) presents to birthplace via ED w/ c/o 10/10 headache that has lasted 3 days and unrelieved by tylenol, elevated BP at home, nausea, swelling in legs, and some blurry vision. Pt reports some mild irregular ctx, no vag bleeding, no LOF, and positive fetal movement. Monitors applied and assessing, initial FHT's in 140's. Elevated BP of 153/88.

## 2021-02-16 ENCOUNTER — Inpatient Hospital Stay: Payer: Medicaid Other

## 2021-02-16 DIAGNOSIS — Z20822 Contact with and (suspected) exposure to covid-19: Secondary | ICD-10-CM | POA: Diagnosis present

## 2021-02-16 DIAGNOSIS — Z8616 Personal history of COVID-19: Secondary | ICD-10-CM | POA: Diagnosis not present

## 2021-02-16 DIAGNOSIS — O1414 Severe pre-eclampsia complicating childbirth: Secondary | ICD-10-CM | POA: Diagnosis present

## 2021-02-16 DIAGNOSIS — D62 Acute posthemorrhagic anemia: Secondary | ICD-10-CM | POA: Diagnosis not present

## 2021-02-16 DIAGNOSIS — Z3A34 34 weeks gestation of pregnancy: Secondary | ICD-10-CM | POA: Diagnosis not present

## 2021-02-16 DIAGNOSIS — O9081 Anemia of the puerperium: Secondary | ICD-10-CM | POA: Diagnosis not present

## 2021-02-16 DIAGNOSIS — O1490 Unspecified pre-eclampsia, unspecified trimester: Secondary | ICD-10-CM

## 2021-02-16 HISTORY — DX: Unspecified pre-eclampsia, unspecified trimester: O14.90

## 2021-02-16 LAB — COMPREHENSIVE METABOLIC PANEL
ALT: 15 U/L (ref 0–44)
AST: 25 U/L (ref 15–41)
Albumin: 2.8 g/dL — ABNORMAL LOW (ref 3.5–5.0)
Alkaline Phosphatase: 110 U/L (ref 38–126)
Anion gap: 8 (ref 5–15)
BUN: 6 mg/dL (ref 6–20)
CO2: 20 mmol/L — ABNORMAL LOW (ref 22–32)
Calcium: 7.6 mg/dL — ABNORMAL LOW (ref 8.9–10.3)
Chloride: 107 mmol/L (ref 98–111)
Creatinine, Ser: 0.52 mg/dL (ref 0.44–1.00)
GFR, Estimated: >60 mL/min (ref >60)
Glucose, Bld: 117 mg/dL — ABNORMAL HIGH (ref 70–99)
Potassium: 3.9 mmol/L (ref 3.5–5.1)
Sodium: 135 mmol/L (ref 135–145)
Total Bilirubin: 0.5 mg/dL (ref 0.3–1.2)
Total Protein: 5.9 g/dL — ABNORMAL LOW (ref 6.5–8.1)

## 2021-02-16 LAB — CBC
HCT: 30.9 % — ABNORMAL LOW (ref 36.0–46.0)
Hemoglobin: 10.3 g/dL — ABNORMAL LOW (ref 12.0–15.0)
MCH: 27.1 pg (ref 26.0–34.0)
MCHC: 33.3 g/dL (ref 30.0–36.0)
MCV: 81.3 fL (ref 80.0–100.0)
Platelets: 236 10*3/uL (ref 150–400)
RBC: 3.8 MIL/uL — ABNORMAL LOW (ref 3.87–5.11)
RDW: 13.2 % (ref 11.5–15.5)
WBC: 16.9 10*3/uL — ABNORMAL HIGH (ref 4.0–10.5)
nRBC: 0 % (ref 0.0–0.2)

## 2021-02-16 LAB — RESP PANEL BY RT-PCR (FLU A&B, COVID) ARPGX2
Influenza A by PCR: NEGATIVE
Influenza B by PCR: NEGATIVE
SARS Coronavirus 2 by RT PCR: NEGATIVE

## 2021-02-16 LAB — ABO/RH: ABO/RH(D): O POS

## 2021-02-16 LAB — TYPE AND SCREEN
ABO/RH(D): O POS
Antibody Screen: NEGATIVE

## 2021-02-16 LAB — GROUP B STREP BY PCR: Group B strep by PCR: NEGATIVE

## 2021-02-16 LAB — MAGNESIUM
Magnesium: 5.2 mg/dL — ABNORMAL HIGH (ref 1.7–2.4)
Magnesium: 5.8 mg/dL — ABNORMAL HIGH (ref 1.7–2.4)

## 2021-02-16 MED ORDER — AMMONIA AROMATIC IN INHA
RESPIRATORY_TRACT | Status: AC
  Filled 2021-02-16: qty 10

## 2021-02-16 MED ORDER — LIDOCAINE HCL (PF) 1 % IJ SOLN
30.0000 mL | INTRAMUSCULAR | Status: DC | PRN
  Filled 2021-02-16: qty 30

## 2021-02-16 MED ORDER — OXYCODONE-ACETAMINOPHEN 5-325 MG PO TABS: 2.0000 | ORAL_TABLET | ORAL | Status: DC | PRN

## 2021-02-16 MED ORDER — PENICILLIN G POT IN DEXTROSE 60000 UNIT/ML IV SOLN: 3.0000 10*6.[IU] | INTRAVENOUS | Status: DC

## 2021-02-16 MED ORDER — OXYTOCIN-SODIUM CHLORIDE 30-0.9 UT/500ML-% IV SOLN: 2.5000 [IU]/h | INTRAVENOUS | Status: DC

## 2021-02-16 MED ORDER — LABETALOL HCL 5 MG/ML IV SOLN: 20.0000 mg | INTRAVENOUS | Status: DC | PRN

## 2021-02-16 MED ORDER — SOD CITRATE-CITRIC ACID 500-334 MG/5ML PO SOLN: 30.0000 mL | ORAL | Status: DC | PRN

## 2021-02-16 MED ORDER — ONDANSETRON HCL 4 MG/2ML IJ SOLN
4.0000 mg | Freq: Four times a day (QID) | INTRAMUSCULAR | Status: DC | PRN
  Administered 2021-02-16 – 2021-02-17 (×6): 4 mg via INTRAVENOUS
  Filled 2021-02-16 (×6): qty 2

## 2021-02-16 MED ORDER — HYDRALAZINE HCL 20 MG/ML IJ SOLN: 10.0000 mg | INTRAMUSCULAR | Status: DC | PRN

## 2021-02-16 MED ORDER — LABETALOL HCL 5 MG/ML IV SOLN: 80.0000 mg | INTRAVENOUS | Status: DC | PRN

## 2021-02-16 MED ORDER — MAGNESIUM SULFATE 40 GM/1000ML IV SOLN
2.0000 g/h | INTRAVENOUS | Status: DC
  Administered 2021-02-16 – 2021-02-17 (×3): 2 g/h via INTRAVENOUS
  Filled 2021-02-16 (×3): qty 1000

## 2021-02-16 MED ORDER — BETAMETHASONE SOD PHOS & ACET 6 (3-3) MG/ML IJ SUSP
12.0000 mg | INTRAMUSCULAR | Status: AC
  Administered 2021-02-16 – 2021-02-17 (×2): 12 mg via INTRAMUSCULAR
  Filled 2021-02-16 (×2): qty 5

## 2021-02-16 MED ORDER — LABETALOL HCL 5 MG/ML IV SOLN: 40.0000 mg | INTRAVENOUS | Status: DC | PRN

## 2021-02-16 MED ORDER — MISOPROSTOL 50MCG HALF TABLET
50.0000 ug | ORAL_TABLET | Freq: Once | ORAL | Status: AC
  Administered 2021-02-16: 50 ug via BUCCAL

## 2021-02-16 MED ORDER — OXYCODONE-ACETAMINOPHEN 5-325 MG PO TABS: 1.0000 | ORAL_TABLET | ORAL | Status: DC | PRN

## 2021-02-16 MED ORDER — TERBUTALINE SULFATE 1 MG/ML IJ SOLN: 0.2500 mg | Freq: Once | INTRAMUSCULAR | Status: DC | PRN

## 2021-02-16 MED ORDER — CALCIUM GLUCONATE 10 % IV SOLN
INTRAVENOUS | Status: AC
  Filled 2021-02-16: qty 10

## 2021-02-16 MED ORDER — MISOPROSTOL 50MCG HALF TABLET
ORAL_TABLET | ORAL | Status: AC
  Filled 2021-02-16: qty 1

## 2021-02-16 MED ORDER — MISOPROSTOL 25 MCG QUARTER TABLET
25.0000 ug | ORAL_TABLET | ORAL | Status: DC | PRN
  Administered 2021-02-16 (×2): 25 ug via VAGINAL
  Filled 2021-02-16 (×2): qty 1

## 2021-02-16 MED ORDER — MISOPROSTOL 50MCG HALF TABLET
50.0000 ug | ORAL_TABLET | ORAL | Status: DC | PRN
  Administered 2021-02-16 – 2021-02-17 (×2): 50 ug via BUCCAL
  Filled 2021-02-16 (×2): qty 1

## 2021-02-16 MED ORDER — LACTATED RINGERS IV SOLN: 500.0000 mL | INTRAVENOUS | Status: DC | PRN

## 2021-02-16 MED ORDER — SODIUM CHLORIDE 0.9 % IV SOLN: 5.0000 10*6.[IU] | Freq: Once | INTRAVENOUS | Status: DC

## 2021-02-16 MED ORDER — OXYTOCIN BOLUS FROM INFUSION
333.0000 mL | Freq: Once | INTRAVENOUS | Status: AC
  Administered 2021-02-17: 333 mL via INTRAVENOUS

## 2021-02-16 MED ORDER — LACTATED RINGERS IV SOLN: INTRAVENOUS | Status: DC

## 2021-02-16 MED ORDER — MISOPROSTOL 25 MCG QUARTER TABLET
25.0000 ug | ORAL_TABLET | ORAL | Status: DC
  Administered 2021-02-16 (×2): 25 ug via BUCCAL
  Filled 2021-02-16 (×2): qty 1

## 2021-02-16 MED ORDER — MAGNESIUM SULFATE BOLUS VIA INFUSION
4.0000 g | Freq: Once | INTRAVENOUS | Status: AC
  Administered 2021-02-16: 4 g via INTRAVENOUS
  Filled 2021-02-16: qty 1000

## 2021-02-16 MED ORDER — OXYTOCIN-SODIUM CHLORIDE 30-0.9 UT/500ML-% IV SOLN
1.0000 m[IU]/min | INTRAVENOUS | Status: DC
Start: 2021-02-16 — End: 2021-02-17
  Administered 2021-02-17: 2 m[IU]/min via INTRAVENOUS
  Filled 2021-02-16: qty 500

## 2021-02-16 MED ORDER — ACETAMINOPHEN 325 MG PO TABS
650.0000 mg | ORAL_TABLET | ORAL | Status: DC | PRN
  Administered 2021-02-16 – 2021-02-17 (×3): 650 mg via ORAL
  Filled 2021-02-16 (×3): qty 2

## 2021-02-16 NOTE — Anesthesia Preprocedure Evaluation (Addendum)
Anesthesia Evaluation  Patient identified by MRN, date of birth, ID band Patient awake    Reviewed: Allergy & Precautions, H&P , NPO status , Patient's Chart, lab work & pertinent test results, reviewed documented beta blocker date and time   History of Anesthesia Complications Negative for: history of anesthetic complications  Airway Mallampati: II  TM Distance: >3 FB Neck ROM: full    Dental no notable dental hx. (+) Chipped   Pulmonary neg pulmonary ROS,    Pulmonary exam normal breath sounds clear to auscultation       Cardiovascular Exercise Tolerance: Good hypertension, Normal cardiovascular exam Rhythm:regular Rate:Normal     Neuro/Psych negative neurological ROS  negative psych ROS   GI/Hepatic negative GI ROS, Neg liver ROS,   Endo/Other  negative endocrine ROS  Renal/GU negative Renal ROS  negative genitourinary   Musculoskeletal   Abdominal   Peds  Hematology negative hematology ROS (+)   Anesthesia Other Findings Pre E w features on Mag  Past Medical History: No date: Medical history non-contributory  Past Surgical History: No date: NO PAST SURGERIES  BMI    Body Mass Index: 30.18 kg/m      Reproductive/Obstetrics (+) Pregnancy                            Anesthesia Physical Anesthesia Plan  ASA: 4  Anesthesia Plan: Epidural   Post-op Pain Management:    Induction:   PONV Risk Score and Plan:   Airway Management Planned: Natural Airway  Additional Equipment:   Intra-op Plan:   Post-operative Plan:   Informed Consent: I have reviewed the patients History and Physical, chart, labs and discussed the procedure including the risks, benefits and alternatives for the proposed anesthesia with the patient or authorized representative who has indicated his/her understanding and acceptance.     Dental Advisory Given  Plan Discussed with: CRNA  Anesthesia  Plan Comments: (Patient reports no bleeding problems and no anticoagulant use.   Patient consented for risks of anesthesia including but not limited to:  - adverse reactions to medications - risk of bleeding, infection and or nerve damage from epidural that could lead to paralysis - risk of headache or failed epidural - nerve damage due to positioning - that if epidural is used for C-section that there is a chance of epidural failure requiring spinal placement or conversion to GA - Damage to heart, brain, lungs, other parts of body or loss of life  Patient voiced understanding.)       Anesthesia Quick Evaluation

## 2021-02-16 NOTE — Progress Notes (Signed)
Pt called RN to room at 1835. Pt reports difficulty chewing her dinner and feeling weak. Pt reports legs feeling heavy and feeling like she is going to pass out. VS taken; VS stable. Lungs sound clear. Plus 3 reflexes. MD called to bedside. Dr. Dalbert Garnet at bedside at 1841 to assess pt. Verbal order for stat mag level. MD discussed plan of care with pt and family. RN will continue to watch pt closely.

## 2021-02-16 NOTE — Progress Notes (Signed)
Labor Progress Note  Diane Morse is a 25 y.o. G1P0 at [redacted]w[redacted]d by LMP admitted for induction of labor due to Severe Pre-E.  Subjective: has HA currently, no VD or RUQ pain. Feeling mild cramping.   Objective: BP 135/88 (BP Location: Right Arm)   Pulse 88   Temp 98.2 F (36.8 C) (Oral)   Resp 16   Ht 5\' 2"  (1.575 m)   Wt 74.8 kg   LMP 06/20/2020 (Exact Date)   SpO2 97%   BMI 30.18 kg/m  Notable VS details: reviewed BPs  Lungs CTAB DTRs 2+, no clonus  Fetal Assessment: FHT:  FHR: 125 bpm, variability: moderate,  accelerations:  Present,  decelerations:  Absent Category/reactivity:  Category I UC:   irregular, every 1-5 minutes SVE:   Closed/50/-2, soft, posterior and displaced to pt left.   Membrane status:intact Amniotic color: n/a   Intake/Output Summary (Last 24 hours) at 02/16/2021 1053 Last data filed at 02/16/2021 1000 Gross per 24 hour  Intake 2564.95 ml  Output 1575 ml  Net 989.95 ml     Labs: Lab Results  Component Value Date   WBC 13.2 (H) 02/15/2021   HGB 10.8 (L) 02/15/2021   HCT 31.7 (L) 02/15/2021   MCV 80.7 02/15/2021   PLT 233 02/15/2021    Assessment / Plan: G2P0 at 34.3wks, IOL for Pre-E with severe features.   Labor:  cervical ripening continues with Cytotec. Buccal 04/17/2021 given now.  Preeclampsia:  on magnesium sulfate and no signs or symptoms of toxicity; Urine output good Fetal Wellbeing:  Category I Pain Control:  Labor support without medications I/D:  n/a Anticipated MOD:  NSVD  Roshell Brigham, CNM 02/16/2021, 8:45 AM

## 2021-02-16 NOTE — Consult Note (Signed)
I had the pleasure of speaking with Ms. Diane Morse about the impending delivery of her son at [redacted] weeks gestation. She is undergoing IOL secondary to pre-eclampsia with severe features. I explained that we would attend the delivery and discussed the initial course for an infant born at 34 weeks including the possible need for respiratory support. Ms. Diane Morse would like to hold her son after delivery. I reassured her that if he was vigorous, he could spend some time skin-to-skin with her prior to being transferred to the SCN. We also discussed the typical needs for late preterm infants including thermoregulation, gavage feeds and continuous monitoring. She understands her son will likely remain in the SCN for several weeks. We also discussed the importance of breastmilk and the support available to initiate and maintain her milk supply. After answering all her questions, I reassured Ms. Diane Morse that we are available at any time to answer any further questions she might have.

## 2021-02-16 NOTE — Progress Notes (Signed)
Labor Progress Note  Diane Morse is a 25 y.o. G1P0 at [redacted]w[redacted]d by LMP admitted for induction of labor due to Severe Pre-E.  Subjective: has dizziness currently, no VD or RUQ pain. Feeling mild cramping and generally weak. Is now having trouble chewing because of the weakness. Feeling chest pressure  Objective: BP (!) 139/95   Pulse 96   Temp 98.6 F (37 C) (Oral)   Resp 14   Ht 5\' 2"  (1.575 m)   Wt 74.8 kg   LMP 06/20/2020 (Exact Date)   SpO2 99%   BMI 30.18 kg/m  Notable VS details: reviewed BPs  Lungs CTAB DTRs 3+, no clonus - increased from 1-2+  Fetal Assessment: FHT:  FHR: 125 bpm, variability: moderate,  accelerations:  Present,  decelerations:  Absent Category/reactivity:  Category I UC:   irregular, every 3-5 minutes SVE:  deferred  Membrane status:intact Amniotic color: n/a   Intake/Output Summary (Last 24 hours) at 02/16/2021 1914 Last data filed at 02/16/2021 1900 Gross per 24 hour  Intake 4386.34 ml  Output 2525 ml  Net 1861.34 ml     Labs: Lab Results  Component Value Date   WBC 16.9 (H) 02/16/2021   HGB 10.3 (L) 02/16/2021   HCT 30.9 (L) 02/16/2021   MCV 81.3 02/16/2021   PLT 236 02/16/2021    Assessment / Plan: G2P0 at 34.3wks, IOL for Pre-E with severe features.   Labor: cervical ripening, s/p 4 doses of cytotec. Discussed POC with BEB and pt family, will give 5th dose of cytotec at 0100 when 2nd dose of BMZ is due. Plan to start Pitocin in AM, consider foley balloon once cx 1-2cm.   Preeclampsia:  on magnesium sulfate and now with dizziness and weakness ; Urine output good, peated labs all WNL at 1pm with an addon mag level that was wnl.  - repeat mag level now - brisk reflexes, appears weak and having difficultly attending, but no obtunded and asking appropriate questions - I worry that her level is not adequate from her reflexes but her mental status is more like supratheurapeutic.   Fetal Wellbeing:  Category I Pain Control:  Labor  support without medications I/D:  n/a Anticipated MOD:  NSVD  04/18/2021, CNM 02/16/2021, 7:14 PM

## 2021-02-16 NOTE — Progress Notes (Signed)
Labor Progress Note  Diane Morse is a 25 y.o. G1P0 at [redacted]w[redacted]d by LMP admitted for induction of labor due to Severe Pre-E.  Subjective: has HA currently, no VD or RUQ pain. Feeling mild cramping.   Objective: BP 124/86   Pulse 97   Temp 98.2 F (36.8 C) (Oral)   Resp 16   Ht 5\' 2"  (1.575 m)   Wt 74.8 kg   LMP 06/20/2020 (Exact Date)   SpO2 95%   BMI 30.18 kg/m  Notable VS details: reviewed BPs  Lungs CTAB DTRs 2+, no clonus  Fetal Assessment: FHT:  FHR: 125 bpm, variability: moderate,  accelerations:  Present,  decelerations:  Absent Category/reactivity:  Category I UC:   irregular, every 1-5 minutes SVE:   Closed/50/-2, softer, posterior   Membrane status:intact Amniotic color: n/a   Intake/Output Summary (Last 24 hours) at 02/16/2021 1303 Last data filed at 02/16/2021 1200 Gross per 24 hour  Intake 2914.33 ml  Output 1825 ml  Net 1089.33 ml      Labs: Lab Results  Component Value Date   WBC 13.2 (H) 02/15/2021   HGB 10.8 (L) 02/15/2021   HCT 31.7 (L) 02/15/2021   MCV 80.7 02/15/2021   PLT 233 02/15/2021    Assessment / Plan: G2P0 at 34.3wks, IOL for Pre-E with severe features.   Labor: cervical ripening, will repeat buccal cytotec now.  Preeclampsia:  on magnesium sulfate and no signs or symptoms of toxicity; Urine output good, rpeat Labs now.  Fetal Wellbeing:  Category I Pain Control:  Labor support without medications I/D:  n/a Anticipated MOD:  NSVD  04/17/2021 Jordin Vicencio, CNM 02/16/2021, 1:03 PM

## 2021-02-16 NOTE — Progress Notes (Signed)
Labor Progress Note  Diane Morse is a 25 y.o. G1P0 at [redacted]w[redacted]d by LMP admitted for induction of labor due to Severe Pre-E.  Subjective: has dizziness currently, no VD or RUQ pain. Feeling mild cramping and generally weak.   Objective: BP 136/88   Pulse 94   Temp 98.6 F (37 C) (Oral)   Resp 14   Ht 5\' 2"  (1.575 m)   Wt 74.8 kg   LMP 06/20/2020 (Exact Date)   SpO2 98%   BMI 30.18 kg/m  Notable VS details: reviewed BPs  Lungs CTAB DTRs 1-2+, no clonus  Fetal Assessment: FHT:  FHR: 125 bpm, variability: moderate,  accelerations:  Present,  decelerations:  Absent Category/reactivity:  Category I UC:   irregular, every 3-5 minutes SVE:   Closed/50/-2, softer, posterior   Membrane status:intact Amniotic color: n/a   Intake/Output Summary (Last 24 hours) at 02/16/2021 1803 Last data filed at 02/16/2021 1700 Gross per 24 hour  Intake 3776.42 ml  Output 2325 ml  Net 1451.42 ml     Labs: Lab Results  Component Value Date   WBC 16.9 (H) 02/16/2021   HGB 10.3 (L) 02/16/2021   HCT 30.9 (L) 02/16/2021   MCV 81.3 02/16/2021   PLT 236 02/16/2021    Assessment / Plan: G2P0 at 34.3wks, IOL for Pre-E with severe features.   Labor: cervical ripening, s/p 4 doses of cytotec. Discussed POC with BEB and pt family, will give 5th dose of cytotec at 0100 when 2nd dose of BMZ is due. Plan to start Pitocin in AM, consider foley balloon once cx 1-2cm.  Preeclampsia:  on magnesium sulfate and no signs or symptoms of toxicity; Urine output good, Mag level with repeated labs all WNL.  - plan repeat labs in AM Fetal Wellbeing:  Category I Pain Control:  Labor support without medications I/D:  n/a Anticipated MOD:  NSVD  04/18/2021, CNM 02/16/2021, 6:03 PM

## 2021-02-17 ENCOUNTER — Inpatient Hospital Stay: Payer: Medicaid Other | Admitting: Anesthesiology

## 2021-02-17 ENCOUNTER — Encounter: Payer: Self-pay | Admitting: Obstetrics and Gynecology

## 2021-02-17 LAB — CBC WITH DIFFERENTIAL/PLATELET
Abs Immature Granulocytes: 0.18 10*3/uL — ABNORMAL HIGH (ref 0.00–0.07)
Basophils Absolute: 0 10*3/uL (ref 0.0–0.1)
Basophils Relative: 0 %
Eosinophils Absolute: 0 10*3/uL (ref 0.0–0.5)
Eosinophils Relative: 0 %
HCT: 31.5 % — ABNORMAL LOW (ref 36.0–46.0)
Hemoglobin: 10.4 g/dL — ABNORMAL LOW (ref 12.0–15.0)
Immature Granulocytes: 2 %
Lymphocytes Relative: 11 %
Lymphs Abs: 1.4 10*3/uL (ref 0.7–4.0)
MCH: 26.7 pg (ref 26.0–34.0)
MCHC: 33 g/dL (ref 30.0–36.0)
MCV: 80.8 fL (ref 80.0–100.0)
Monocytes Absolute: 0.3 10*3/uL (ref 0.1–1.0)
Monocytes Relative: 3 %
Neutro Abs: 10.5 10*3/uL — ABNORMAL HIGH (ref 1.7–7.7)
Neutrophils Relative %: 84 %
Platelets: 237 10*3/uL (ref 150–400)
RBC: 3.9 MIL/uL (ref 3.87–5.11)
RDW: 13.4 % (ref 11.5–15.5)
WBC: 12.4 10*3/uL — ABNORMAL HIGH (ref 4.0–10.5)
nRBC: 0 % (ref 0.0–0.2)

## 2021-02-17 LAB — CBC
HCT: 29.9 % — ABNORMAL LOW (ref 36.0–46.0)
Hemoglobin: 10 g/dL — ABNORMAL LOW (ref 12.0–15.0)
MCH: 27.4 pg (ref 26.0–34.0)
MCHC: 33.4 g/dL (ref 30.0–36.0)
MCV: 81.9 fL (ref 80.0–100.0)
Platelets: 223 10*3/uL (ref 150–400)
RBC: 3.65 MIL/uL — ABNORMAL LOW (ref 3.87–5.11)
RDW: 13.2 % (ref 11.5–15.5)
WBC: 18.2 10*3/uL — ABNORMAL HIGH (ref 4.0–10.5)
nRBC: 0 % (ref 0.0–0.2)

## 2021-02-17 LAB — COMPREHENSIVE METABOLIC PANEL
ALT: 14 U/L (ref 0–44)
AST: 27 U/L (ref 15–41)
Albumin: 2.9 g/dL — ABNORMAL LOW (ref 3.5–5.0)
Alkaline Phosphatase: 118 U/L (ref 38–126)
Anion gap: 8 (ref 5–15)
BUN: 8 mg/dL (ref 6–20)
CO2: 20 mmol/L — ABNORMAL LOW (ref 22–32)
Calcium: 6.7 mg/dL — ABNORMAL LOW (ref 8.9–10.3)
Chloride: 106 mmol/L (ref 98–111)
Creatinine, Ser: 0.6 mg/dL (ref 0.44–1.00)
GFR, Estimated: >60 mL/min (ref >60)
Glucose, Bld: 122 mg/dL — ABNORMAL HIGH (ref 70–99)
Potassium: 3.7 mmol/L (ref 3.5–5.1)
Sodium: 134 mmol/L — ABNORMAL LOW (ref 135–145)
Total Bilirubin: 0.6 mg/dL (ref 0.3–1.2)
Total Protein: 6.4 g/dL — ABNORMAL LOW (ref 6.5–8.1)

## 2021-02-17 LAB — MAGNESIUM: Magnesium: 6.1 mg/dL (ref 1.7–2.4)

## 2021-02-17 LAB — RPR: RPR Ser Ql: NONREACTIVE

## 2021-02-17 MED ORDER — LIDOCAINE HCL (PF) 1 % IJ SOLN
INTRAMUSCULAR | Status: DC | PRN
  Administered 2021-02-17: 1 mL via SUBCUTANEOUS

## 2021-02-17 MED ORDER — LACTATED RINGERS IV SOLN
500.0000 mL | Freq: Once | INTRAVENOUS | Status: AC
  Administered 2021-02-17: 500 mL via INTRAVENOUS

## 2021-02-17 MED ORDER — FENTANYL 2.5 MCG/ML W/ROPIVACAINE 0.15% IN NS 100 ML EPIDURAL (ARMC): 12.0000 mL/h | EPIDURAL | Status: DC

## 2021-02-17 MED ORDER — FENTANYL 2.5 MCG/ML W/ROPIVACAINE 0.15% IN NS 100 ML EPIDURAL (ARMC)
EPIDURAL | Status: DC | PRN
  Administered 2021-02-17: 12 mL/h via EPIDURAL

## 2021-02-17 MED ORDER — PHENYLEPHRINE 40 MCG/ML (10ML) SYRINGE FOR IV PUSH (FOR BLOOD PRESSURE SUPPORT): 80.0000 ug | PREFILLED_SYRINGE | INTRAVENOUS | Status: DC | PRN

## 2021-02-17 MED ORDER — LIDOCAINE-EPINEPHRINE (PF) 1.5 %-1:200000 IJ SOLN
INTRAMUSCULAR | Status: DC | PRN
  Administered 2021-02-17: 3 mL via EPIDURAL

## 2021-02-17 MED ORDER — MISOPROSTOL 50MCG HALF TABLET: 50.0000 ug | ORAL_TABLET | Freq: Once | ORAL | Status: DC

## 2021-02-17 MED ORDER — MISOPROSTOL 200 MCG PO TABS: 800.0000 ug | ORAL_TABLET | Freq: Once | ORAL | Status: AC

## 2021-02-17 MED ORDER — SODIUM CHLORIDE 0.9 % IV SOLN
INTRAVENOUS | Status: DC | PRN
  Administered 2021-02-17 (×2): 5 mL via EPIDURAL

## 2021-02-17 MED ORDER — TRANEXAMIC ACID-NACL 1000-0.7 MG/100ML-% IV SOLN
INTRAVENOUS | Status: AC
  Administered 2021-02-17: 1000 mg
  Filled 2021-02-17: qty 100

## 2021-02-17 MED ORDER — DIPHENHYDRAMINE HCL 50 MG/ML IJ SOLN: 12.5000 mg | INTRAMUSCULAR | Status: DC | PRN

## 2021-02-17 MED ORDER — EPHEDRINE 5 MG/ML INJ: 10.0000 mg | INTRAVENOUS | Status: DC | PRN

## 2021-02-17 MED ORDER — OXYTOCIN-SODIUM CHLORIDE 30-0.9 UT/500ML-% IV SOLN
1.0000 m[IU]/min | INTRAVENOUS | Status: DC
  Administered 2021-02-17: 12 m[IU]/min via INTRAVENOUS
  Filled 2021-02-17: qty 500

## 2021-02-17 MED ORDER — MISOPROSTOL 200 MCG PO TABS
ORAL_TABLET | ORAL | Status: AC
  Administered 2021-02-17: 800 ug via RECTAL
  Filled 2021-02-17: qty 4

## 2021-02-17 MED ORDER — FENTANYL 2.5 MCG/ML W/ROPIVACAINE 0.15% IN NS 100 ML EPIDURAL (ARMC)
EPIDURAL | Status: AC
  Filled 2021-02-17: qty 100

## 2021-02-17 NOTE — Anesthesia Procedure Notes (Signed)
Epidural Patient location during procedure: OB Start time: 02/17/2021 7:52 PM End time: 02/17/2021 7:56 PM  Staffing Anesthesiologist: Stepheni Cameron, Cleda Mccreedy, MD Performed: anesthesiologist   Preanesthetic Checklist Completed: patient identified, IV checked, site marked, risks and benefits discussed, surgical consent, monitors and equipment checked, pre-op evaluation and timeout performed  Epidural Patient position: sitting Prep: ChloraPrep Patient monitoring: heart rate, continuous pulse ox and blood pressure Approach: midline Location: L3-L4 Injection technique: LOR saline  Needle:  Needle type: Tuohy  Needle gauge: 17 G Needle length: 9 cm and 9 Needle insertion depth: 5 cm Catheter type: closed end flexible Catheter size: 19 Gauge Catheter at skin depth: 10 cm Test dose: negative and 1.5% lidocaine with Epi 1:200 K  Assessment Sensory level: T10 Events: blood not aspirated, injection not painful, no injection resistance, no paresthesia and negative IV test  Additional Notes 1 attempt Pt. Evaluated and documentation done after procedure finished. Patient identified. Risks/Benefits/Options discussed with patient including but not limited to bleeding, infection, nerve damage, paralysis, failed block, incomplete pain control, headache, blood pressure changes, nausea, vomiting, reactions to medication both or allergic, itching and postpartum back pain. Confirmed with bedside nurse the patient's most recent platelet count. Confirmed with patient that they are not currently taking any anticoagulation, have any bleeding history or any family history of bleeding disorders. Patient expressed understanding and wished to proceed. All questions were answered. Sterile technique was used throughout the entire procedure. Please see nursing notes for vital signs. Test dose was given through epidural catheter and negative prior to continuing to dose epidural or start infusion. Warning signs of  high block given to the patient including shortness of breath, tingling/numbness in hands, complete motor block, or any concerning symptoms with instructions to call for help. Patient was given instructions on fall risk and not to get out of bed. All questions and concerns addressed with instructions to call with any issues or inadequate analgesia.    Patient tolerated the insertion well without immediate complications.Reason for block:procedure for pain

## 2021-02-17 NOTE — Progress Notes (Signed)
Labor Progress Note  Diane Morse is a 25 y.o. G1P0 at [redacted]w[redacted]d by LMP admitted for induction of labor due to Severe Pre-E.  Subjective: has dizziness and HA, no VD or RUQ pain. Tired, cramps were painful earlier but now mild.   Objective: BP (!) 149/90   Pulse 78   Temp 98.9 F (37.2 C)   Resp 16   Ht 5\' 2"  (1.575 m)   Wt 74.8 kg   LMP 06/20/2020 (Exact Date)   SpO2 98%   BMI 30.18 kg/m  Notable VS details: reviewed BPs- mild range  Lungs CTAB DTRs 1-2+, no clonus  Fetal Assessment: FHT:  FHR: 125 bpm, variability: moderate,  accelerations:  Present,  decelerations:  Absent Category/reactivity:  Category I UC:   irregular, every 3-5 minutes SVE:  4cm with Cook in place, cervix soft and thinning.   Membrane status:intact Amniotic color: n/a   Intake/Output Summary (Last 24 hours) at 02/17/2021 1640 Last data filed at 02/17/2021 1600 Gross per 24 hour  Intake 4255.31 ml  Output 2350 ml  Net 1905.31 ml     Labs: Lab Results  Component Value Date   WBC 12.4 (H) 02/17/2021   HGB 10.4 (L) 02/17/2021   HCT 31.5 (L) 02/17/2021   MCV 80.8 02/17/2021   PLT 237 02/17/2021   Mag level: 6.1  Assessment / Plan: G2P0 at 34.4wks, IOL for Pre-E with severe features.   Labor: cervical ripening, s/p 5doses of cytotec. Cook cath placed this AM, continue Pitocin at 38mu/min.  - discussed POC with pt, she does not want a CS, and is happy to continue with IOL.  - due to prolonged IOL, will plan to use prophy meds for hemorrhage including cytotec, TXA and Pitocin bolus. Second IV to be placed now.  Preeclampsia:  on magnesium sulfate and no signs or symptoms of toxicity; Urine output good, Labs repeat WNL today. Mag level obtained when pt feeling weak while OOB earlier, 6.1 Fetal Wellbeing:  Category I Pain Control:  Labor support without medications I/D:  n/a Anticipated MOD:  NSVD  30m, CNM 02/17/2021, 4:40 PM

## 2021-02-17 NOTE — Progress Notes (Signed)
Labor Progress Note  Diane Morse is a 25 y.o. G1P0 at [redacted]w[redacted]d by LMP admitted for induction of labor due to Severe Pre-E.  Subjective: has dizziness and HA, no VD or RUQ pain. Feeling mild cramping and generally weak.   Objective: BP (!) 146/96 (BP Location: Left Arm)   Pulse 98   Temp 98.3 F (36.8 C) (Oral)   Resp 16   Ht 5\' 2"  (1.575 m)   Wt 74.8 kg   LMP 06/20/2020 (Exact Date)   SpO2 97%   BMI 30.18 kg/m  Notable VS details: reviewed BPs  Lungs CTAB DTRs 1-2+, no clonus  Fetal Assessment: FHT:  FHR: 125 bpm, variability: moderate,  accelerations:  Present,  decelerations:  Absent Category/reactivity:  Category I UC:   irregular, every 3-5 minutes SVE:   FTP/50/-2, soft, posterior  - Cook Cath placed with direct visualization using speculum. Uterine balloon inflated with 63ml, vaginal balloon with 35ml.   Membrane status:intact Amniotic color: n/a   Intake/Output Summary (Last 24 hours) at 02/17/2021 1115 Last data filed at 02/17/2021 1100 Gross per 24 hour  Intake 4159.97 ml  Output 2500 ml  Net 1659.97 ml     Labs: Lab Results  Component Value Date   WBC 12.4 (H) 02/17/2021   HGB 10.4 (L) 02/17/2021   HCT 31.5 (L) 02/17/2021   MCV 80.8 02/17/2021   PLT 237 02/17/2021    Assessment / Plan: G2P0 at 34.4wks, IOL for Pre-E with severe features.   Labor: cervical ripening, s/p 5doses of cytotec. Cook cath placed, continue Pitocin.  Preeclampsia:  on magnesium sulfate and no signs or symptoms of toxicity; Urine output good, Labs repeat WNL today.  Fetal Wellbeing:  Category I, s/p 2 doses of cytotec.  Pain Control:  Labor support without medications I/D:  n/a Anticipated MOD:  NSVD  04/19/2021, CNM 02/17/2021, 11:15 AM

## 2021-02-18 LAB — CBC
HCT: 27.6 % — ABNORMAL LOW (ref 36.0–46.0)
Hemoglobin: 9.1 g/dL — ABNORMAL LOW (ref 12.0–15.0)
MCH: 27.2 pg (ref 26.0–34.0)
MCHC: 33 g/dL (ref 30.0–36.0)
MCV: 82.6 fL (ref 80.0–100.0)
Platelets: 220 10*3/uL (ref 150–400)
RBC: 3.34 MIL/uL — ABNORMAL LOW (ref 3.87–5.11)
RDW: 13.2 % (ref 11.5–15.5)
WBC: 21 10*3/uL — ABNORMAL HIGH (ref 4.0–10.5)
nRBC: 0 % (ref 0.0–0.2)

## 2021-02-18 LAB — COMPREHENSIVE METABOLIC PANEL
ALT: 14 U/L (ref 0–44)
AST: 29 U/L (ref 15–41)
Albumin: 2.3 g/dL — ABNORMAL LOW (ref 3.5–5.0)
Alkaline Phosphatase: 101 U/L (ref 38–126)
Anion gap: 6 (ref 5–15)
BUN: 9 mg/dL (ref 6–20)
CO2: 20 mmol/L — ABNORMAL LOW (ref 22–32)
Calcium: 6.1 mg/dL — CL (ref 8.9–10.3)
Chloride: 107 mmol/L (ref 98–111)
Creatinine, Ser: 0.66 mg/dL (ref 0.44–1.00)
GFR, Estimated: >60 mL/min (ref >60)
Glucose, Bld: 128 mg/dL — ABNORMAL HIGH (ref 70–99)
Potassium: 3.3 mmol/L — ABNORMAL LOW (ref 3.5–5.1)
Sodium: 133 mmol/L — ABNORMAL LOW (ref 135–145)
Total Bilirubin: 0.4 mg/dL (ref 0.3–1.2)
Total Protein: 5 g/dL — ABNORMAL LOW (ref 6.5–8.1)

## 2021-02-18 LAB — MAGNESIUM: Magnesium: 5.6 mg/dL — ABNORMAL HIGH (ref 1.7–2.4)

## 2021-02-18 MED ORDER — SENNOSIDES-DOCUSATE SODIUM 8.6-50 MG PO TABS
2.0000 | ORAL_TABLET | Freq: Every day | ORAL | Status: DC
  Administered 2021-02-18 – 2021-02-19 (×2): 2 via ORAL
  Filled 2021-02-18 (×2): qty 2

## 2021-02-18 MED ORDER — IBUPROFEN 600 MG PO TABS
600.0000 mg | ORAL_TABLET | Freq: Four times a day (QID) | ORAL | Status: DC
  Administered 2021-02-18 – 2021-02-19 (×6): 600 mg via ORAL
  Filled 2021-02-18 (×6): qty 1

## 2021-02-18 MED ORDER — PRENATAL MULTIVITAMIN CH
1.0000 | ORAL_TABLET | Freq: Every day | ORAL | Status: DC
  Administered 2021-02-18 – 2021-02-19 (×2): 1 via ORAL
  Filled 2021-02-18 (×2): qty 1

## 2021-02-18 MED ORDER — CALCIUM CARBONATE ANTACID 500 MG PO CHEW
2.0000 | CHEWABLE_TABLET | Freq: Three times a day (TID) | ORAL | Status: DC
  Administered 2021-02-18 – 2021-02-19 (×3): 400 mg via ORAL
  Filled 2021-02-18 (×3): qty 2

## 2021-02-18 MED ORDER — COCONUT OIL OIL
1.0000 "application " | TOPICAL_OIL | Status: DC | PRN
  Filled 2021-02-18: qty 120

## 2021-02-18 MED ORDER — DIBUCAINE (PERIANAL) 1 % EX OINT
1.0000 "application " | TOPICAL_OINTMENT | CUTANEOUS | Status: DC | PRN
  Administered 2021-02-18: 1 via RECTAL
  Filled 2021-02-18 (×2): qty 28

## 2021-02-18 MED ORDER — NIFEDIPINE ER OSMOTIC RELEASE 30 MG PO TB24
30.0000 mg | ORAL_TABLET | Freq: Every day | ORAL | Status: DC
  Administered 2021-02-18 – 2021-02-19 (×2): 30 mg via ORAL
  Filled 2021-02-18 (×2): qty 1

## 2021-02-18 MED ORDER — MAGNESIUM SULFATE 40 GM/1000ML IV SOLN: 1.0000 g/h | INTRAVENOUS | Status: DC

## 2021-02-18 MED ORDER — BENZOCAINE-MENTHOL 20-0.5 % EX AERO
1.0000 "application " | INHALATION_SPRAY | CUTANEOUS | Status: DC | PRN
  Administered 2021-02-18: 1 via TOPICAL
  Filled 2021-02-18 (×2): qty 56

## 2021-02-18 MED ORDER — SIMETHICONE 80 MG PO CHEW
80.0000 mg | CHEWABLE_TABLET | ORAL | Status: DC | PRN
  Administered 2021-02-18 (×2): 80 mg via ORAL
  Filled 2021-02-18 (×2): qty 1

## 2021-02-18 MED ORDER — ONDANSETRON HCL 4 MG PO TABS
4.0000 mg | ORAL_TABLET | ORAL | Status: DC | PRN
  Filled 2021-02-18: qty 1

## 2021-02-18 MED ORDER — DIPHENHYDRAMINE HCL 25 MG PO CAPS: 25.0000 mg | ORAL_CAPSULE | Freq: Four times a day (QID) | ORAL | Status: DC | PRN

## 2021-02-18 MED ORDER — OXYCODONE HCL 5 MG PO TABS: 5.0000 mg | ORAL_TABLET | ORAL | Status: DC | PRN

## 2021-02-18 MED ORDER — ONDANSETRON HCL 4 MG/2ML IJ SOLN: 4.0000 mg | INTRAMUSCULAR | Status: DC | PRN

## 2021-02-18 MED ORDER — ACETAMINOPHEN 325 MG PO TABS
650.0000 mg | ORAL_TABLET | ORAL | Status: DC | PRN
  Administered 2021-02-18: 650 mg via ORAL
  Filled 2021-02-18: qty 2

## 2021-02-18 MED ORDER — HYDROCORTISONE 1 % EX CREA
TOPICAL_CREAM | Freq: Three times a day (TID) | CUTANEOUS | Status: DC
  Filled 2021-02-18 (×3): qty 28

## 2021-02-18 MED ORDER — FERROUS SULFATE 325 (65 FE) MG PO TABS
325.0000 mg | ORAL_TABLET | Freq: Two times a day (BID) | ORAL | Status: DC
  Administered 2021-02-18 – 2021-02-19 (×3): 325 mg via ORAL
  Filled 2021-02-18 (×3): qty 1

## 2021-02-18 MED ORDER — WITCH HAZEL-GLYCERIN EX PADS
1.0000 "application " | MEDICATED_PAD | CUTANEOUS | Status: DC | PRN
  Administered 2021-02-18: 1 via TOPICAL
  Filled 2021-02-18 (×2): qty 100

## 2021-02-18 NOTE — Progress Notes (Signed)
Post Partum Day 1 Subjective: Doing well, no complaints.  Tolerating regular diet, pain with PO meds. Foley remains in place, clear yellow urine. Still feeling slight dizziness occasionally.   No CP SOB Fever,Chills, N/V or leg pain; denies nipple or breast pain; no HA change of vision, RUQ/epigastric pain  Objective: BP 106/73   Pulse 75   Temp 98.8 F (37.1 C) (Oral)   Resp 16   Ht 5' 2"  (1.575 m)   Wt 74.8 kg   LMP 06/20/2020 (Exact Date)   SpO2 96%   Breastfeeding Unknown   BMI 30.18 kg/m    Physical Exam:  General: NAD Breasts: soft, nipple abrasions/excoriation/bruising bilaterally.  CV: RRR Pulm: nl effort, CTABL Abdomen: soft, NT, BS x 4 Perineum: small amt labial edema, laceration repair well approximated Lochia: moderate Uterine Fundus: fundus firm and 2 fb below umbilicus DVT Evaluation: no cords, ttp LEs   Recent Labs    02/17/21 1844 02/18/21 0121  HGB 10.0* 9.1*  HCT 29.9* 27.6*  WBC 18.2* 21.0*  PLT 223 220    Assessment/Plan: 25 y.o. G1P0101 postpartum day # 1 -PreE with severe features at 34.4wks  - Continue routine PP care, foley cath removed,  - Lactation consult prn.  - Pre-E: remains on Mag sulfate, decreased to 1gm/hr due to dizziness over night, with appropriate mag level. Diuresing well now. DC Mag now. BP intermittently mild range, will monitor closely, no antihypertensives for now.   - Acute blood loss anemia - hemodynamically stable and asymptomatic; start po ferrous sulfate BID with stool softeners  - elevated WBC, afebrile, will repeat CBC in AM.  - Immunization status: Needs MMR prior to DC    Disposition: Does not desire Dc home today.     Francetta Found, CNM 02/18/2021  11:56 AM

## 2021-02-18 NOTE — Progress Notes (Signed)
Pt transferred from a labor bed to a postpartum bed. Lactation notified of painful nipples and need for different size flanges for breast pump. RN gave pt cooling gels and coconut oil to use in between pumping sessions. Pt complained of some dizziness upon shift assessment and RN noted that no carrier fluids were running with patients Magnesium. RN started LR and encouraged oral hydration and now patient is feeling much better and resting comfortably after a few hours of fluids and rest. CNM updated on patient status and pt denies any needs at this time.

## 2021-02-18 NOTE — Discharge Summary (Signed)
Obstetrical Discharge Summary  Patient Name: Diane Morse DOB: 1995-10-08 MRN: [id number]  Date of Admission: 02/15/2021 Date of Delivery: 02/17/21 Delivered by: Heloise Ochoa CNM Date of Discharge: 02/19/2021  Primary OB: [facility]  SWN:[id number] last menstrual period was 06/20/2020 (exact date). EDC Estimated Date of Delivery: 03/27/21 Gestational Age at Delivery: [redacted]w[redacted]d   Antepartum complications:  1. Uterine S<D 2. Abnormal AFP 3. Rubella non-immune 4. Undiagnosed depression/anxiety 5. Covid-19 infection in pregnancy  8. Migraines  Admitting Diagnosis: Pre-Eclampsia with severe features, 34.4wks Secondary Diagnosis: SVD, 1st deg perineal and left sidewall  Patient Active Problem List   Diagnosis Date Noted   Pre-eclampsia 02/16/2021   Pre-eclampsia 02/16/2021    Augmentation: AROM, Pitocin, Cytotec, and IP Foley Complications: None Intrapartum complications/course: see delivery note Date of Delivery: 02/17/21 Delivered By: Heloise Ochoa CNM Delivery Type: spontaneous vaginal delivery Anesthesia: epidural Placenta: spontaneous Laceration: 1st deg perineal, left sidewall Episiotomy: none Newborn Data: Live born female  Birth Weight: 4 lb 9 oz (2070 g) APGAR: 8, 9  Newborn Delivery   Birth date/time: 02/17/2021 21:41:00 Delivery type: Vaginal, Spontaneous     Postpartum Procedures: Mag sulfate x 18hrs PP  Edinburgh:  Edinburgh Postnatal Depression Scale Screening Tool 02/18/2021  I have been able to laugh and see the funny side of things. 0  I have looked forward with enjoyment to things. 0  I have blamed myself unnecessarily when things went wrong. 1  I have been anxious or worried for no good reason. 1  I have felt scared or panicky for no good reason. 1  Things have been getting on top of me. 0  I have been so unhappy that I have had difficulty sleeping. 0  I have felt sad or miserable. 1  I have been so unhappy that I have been crying. 0  The  thought of harming myself has occurred to me. 0  Edinburgh Postnatal Depression Scale Total 4      Post partum course:  Patient had an uncomplicated postpartum course. Procardia 30mg  XL was started for elevated BP.  By time of discharge on PPD#2, her pain was controlled on oral pain medications; she had appropriate lochia and was ambulating, voiding without difficulty and tolerating regular diet.  She was deemed stable for discharge to home.     Discharge Physical Exam:  BP 121/90 (BP Location: Left Arm)   Pulse 72   Temp 98.6 F (37 C) (Oral)   Resp 18   Ht 5\' 2"  (1.575 m)   Wt 74.8 kg   LMP 06/20/2020 (Exact Date)   SpO2 95%   Breastfeeding Unknown   BMI 30.18 kg/m   General: NAD CV: RRR Pulm: CTABL, nl effort ABD: s/nd/nt, fundus firm and below the umbilicus Lochia: moderate Perineum: well approximated DVT Evaluation: LE non-ttp, no evidence of DVT on exam.  Hemoglobin  Date Value Ref Range Status  02/19/2021 8.8 (L) 12.0 - 15.0 g/dL Final   HCT  Date Value Ref Range Status  02/19/2021 26.8 (L) 36.0 - 46.0 % Final     Disposition: stable, discharge to home. Baby Feeding: per SCN Baby Disposition: SCN  Rh Immune globulin given: n/a Rubella vaccine given: NON-immune, offer vaccination prior to DC Varicella vaccine given: immune Tdap vaccine given in AP or PP setting: 01/04/21 Flu vaccine given in AP or PP setting: declined  Contraception: condoms, NFP  Prenatal Labs:  Blood type/Rh O pos  Antibody screen neg  Rubella Non-Immune  Varicella Immune  RPR  NR  HBsAg Neg  HIV NR  GC neg  Chlamydia neg  Genetic screening MaterniT21 negative, AFP pos  1 hour GTT 153  3 hour GTT F82, 111, 104, 102  GBS pending     Plan:  Diane Morse was discharged to home in good condition. Follow-up appointment with delivering provider in 3d for BP check.   Discharge Medications: Allergies as of 02/19/2021   No Known Allergies      Medication List     STOP  taking these medications    cephALEXin 500 MG capsule Commonly known as: KEFLEX   HYDROcodone-acetaminophen 5-325 MG tablet Commonly known as: Norco       TAKE these medications    acetaminophen 325 MG tablet Commonly known as: Tylenol Take 2 tablets (650 mg total) by mouth every 4 (four) hours as needed (for pain scale < 4). What changed:  when to take this reasons to take this   benzocaine-Menthol 20-0.5 % Aero Commonly known as: DERMOPLAST Apply 1 application topically as needed for irritation (perineal discomfort).   coconut oil Oil Apply 1 application topically as needed.   ferrous sulfate 325 (65 FE) MG tablet Take 1 tablet (325 mg total) by mouth 2 (two) times daily with a meal.   hydrocortisone cream 1 % Apply topically 3 (three) times daily.   ibuprofen 600 MG tablet Commonly known as: ADVIL Take 1 tablet (600 mg total) by mouth every 6 (six) hours. What changed:  when to take this reasons to take this   NIFEdipine 30 MG 24 hr tablet Commonly known as: ADALAT CC Take 1 tablet (30 mg total) by mouth daily. Start taking on: February 20, 2021   ondansetron 4 MG disintegrating tablet Commonly known as: Zofran ODT Take 1 tablet (4 mg total) by mouth every 8 (eight) hours as needed for nausea or vomiting.   senna-docusate 8.6-50 MG tablet Commonly known as: Senokot-S Take 2 tablets by mouth daily. Start taking on: February 20, 2021   simethicone 80 MG chewable tablet Commonly known as: MYLICON Chew 1 tablet (80 mg total) by mouth as needed for flatulence.   witch hazel-glycerin pad Commonly known as: TUCKS Apply 1 application topically as needed for hemorrhoids.         Follow-up Information     Ladeidra Borys, Prudencio Pair, CNM. Schedule an appointment as soon as possible for a visit in 3 day(s).   Specialty: Obstetrics and Gynecology Why: postpartum BP check Contact information: [address] [place] [address] [phone]                  Signed:  Randa Ngo, CNM 02/19/2021  11:36 AM

## 2021-02-18 NOTE — Lactation Note (Signed)
This note was copied from a baby's chart. Lactation Consultation Note  Patient Name: Diane Morse [id number] Date: 02/18/2021 Reason for consult: Follow-up assessment;Mother's request;Primapara;NICU baby;Late-preterm 34-36.6wks;Infant < 6lbs;Other (Comment) (Assisted with pumping again) Age:26 hours  Maternal Data Has patient been taught Hand Expression?: Yes Does the patient have breastfeeding experience prior to this delivery?: No (First baby)  Feeding Mother's Current Feeding Choice: Breast Milk  Mom was induced for gestation hypertension.  Baby was born at 34.4 weeks and is in SCN.  Mom pumping using Symphony in room.  Instructions given on warmth, massage, hand expression, pumping, collection, storage, labeling, cleaning, handling and giving expressed milk.  Mom has gotten colostrum with every pumping so far.  Mom reports her mom having lots of milk as well.  Encouraged mom to pump around every 3 hours or 8 to 12 times in 24 hours.  Referral has been faxed to [facility] to get DEBP.  Lactation name and number has been written on white board and encouraged to call with any questions, concerns or assistance. LATCH Score                    Lactation Tools Discussed/Used Tools: Pump;71F feeding tube / Syringe Flange Size: 30 Breast pump type: Double-Electric Breast Pump Pump Education: Setup, frequency, and cleaning;Milk Storage;Other (comment) Reason for Pumping: Baby in SCN Pumped volume: 5 mL  Interventions Interventions: Breast feeding basics reviewed;Hand express;Expressed milk;Coconut oil;Comfort gels;DEBP;Education  Discharge Discharge Education: Engorgement and breast care;Warning signs for feeding baby;Other (comment) Pump: DEBP (Referral sent to [facility] to get DEBP since baby in SCN) WIC Program: Yes  Consult Status Consult Status: Follow-up Follow-up type: Call as needed    Louis Meckel 02/18/2021, 8:24 PM

## 2021-02-18 NOTE — Progress Notes (Signed)
Pt to SCN to visit with Baby.

## 2021-02-19 LAB — CBC WITH DIFFERENTIAL/PLATELET
Abs Immature Granulocytes: 0.11 10*3/uL — ABNORMAL HIGH (ref 0.00–0.07)
Basophils Absolute: 0.1 10*3/uL (ref 0.0–0.1)
Basophils Relative: 1 %
Eosinophils Absolute: 0.1 10*3/uL (ref 0.0–0.5)
Eosinophils Relative: 0 %
HCT: 26.8 % — ABNORMAL LOW (ref 36.0–46.0)
Hemoglobin: 8.8 g/dL — ABNORMAL LOW (ref 12.0–15.0)
Immature Granulocytes: 1 %
Lymphocytes Relative: 28 %
Lymphs Abs: 3.1 10*3/uL (ref 0.7–4.0)
MCH: 27.2 pg (ref 26.0–34.0)
MCHC: 32.8 g/dL (ref 30.0–36.0)
MCV: 83 fL (ref 80.0–100.0)
Monocytes Absolute: 1.2 10*3/uL — ABNORMAL HIGH (ref 0.1–1.0)
Monocytes Relative: 11 %
Neutro Abs: 6.7 10*3/uL (ref 1.7–7.7)
Neutrophils Relative %: 59 %
Platelets: 204 10*3/uL (ref 150–400)
RBC: 3.23 MIL/uL — ABNORMAL LOW (ref 3.87–5.11)
RDW: 13.5 % (ref 11.5–15.5)
WBC: 11.3 10*3/uL — ABNORMAL HIGH (ref 4.0–10.5)
nRBC: 0 % (ref 0.0–0.2)

## 2021-02-19 LAB — COMPREHENSIVE METABOLIC PANEL
ALT: 18 U/L (ref 0–44)
AST: 27 U/L (ref 15–41)
Albumin: 2.3 g/dL — ABNORMAL LOW (ref 3.5–5.0)
Alkaline Phosphatase: 96 U/L (ref 38–126)
Anion gap: 5 (ref 5–15)
BUN: 10 mg/dL (ref 6–20)
CO2: 25 mmol/L (ref 22–32)
Calcium: 8.1 mg/dL — ABNORMAL LOW (ref 8.9–10.3)
Chloride: 109 mmol/L (ref 98–111)
Creatinine, Ser: 0.55 mg/dL (ref 0.44–1.00)
GFR, Estimated: >60 mL/min (ref >60)
Glucose, Bld: 74 mg/dL (ref 70–99)
Potassium: 4.1 mmol/L (ref 3.5–5.1)
Sodium: 139 mmol/L (ref 135–145)
Total Bilirubin: 0.3 mg/dL (ref 0.3–1.2)
Total Protein: 5.1 g/dL — ABNORMAL LOW (ref 6.5–8.1)

## 2021-02-19 MED ORDER — SIMETHICONE 80 MG PO CHEW: 80.0000 mg | CHEWABLE_TABLET | ORAL | 0 refills | Status: DC | PRN

## 2021-02-19 MED ORDER — IBUPROFEN 600 MG PO TABS: 600.0000 mg | ORAL_TABLET | Freq: Four times a day (QID) | ORAL | 0 refills | Status: DC

## 2021-02-19 MED ORDER — NIFEDIPINE ER 30 MG PO TB24: 30.0000 mg | ORAL_TABLET | Freq: Every day | ORAL | 0 refills | Status: DC

## 2021-02-19 MED ORDER — SENNOSIDES-DOCUSATE SODIUM 8.6-50 MG PO TABS: 2.0000 | ORAL_TABLET | Freq: Every day | ORAL | 0 refills | Status: DC

## 2021-02-19 MED ORDER — BENZOCAINE-MENTHOL 20-0.5 % EX AERO: 1.0000 "application " | INHALATION_SPRAY | CUTANEOUS | Status: DC | PRN

## 2021-02-19 MED ORDER — WITCH HAZEL-GLYCERIN EX PADS: 1.0000 "application " | MEDICATED_PAD | CUTANEOUS | 12 refills | Status: DC | PRN

## 2021-02-19 MED ORDER — COCONUT OIL OIL: 1.0000 "application " | TOPICAL_OIL | 0 refills | Status: DC | PRN

## 2021-02-19 MED ORDER — FERROUS SULFATE 325 (65 FE) MG PO TABS: 325.0000 mg | ORAL_TABLET | Freq: Two times a day (BID) | ORAL | 0 refills | Status: DC

## 2021-02-19 MED ORDER — HYDROCORTISONE 1 % EX CREA: TOPICAL_CREAM | Freq: Three times a day (TID) | CUTANEOUS | 0 refills | Status: DC

## 2021-02-19 MED ORDER — ACETAMINOPHEN 325 MG PO TABS: 650.0000 mg | ORAL_TABLET | ORAL | 0 refills | Status: DC | PRN

## 2021-02-19 NOTE — Discharge Instructions (Signed)
Discharge Instructions:   Follow-up Appointment:  If there are any new medications, they have been ordered and will be available for pickup at the listed pharmacy on your way home from the hospital.   Call office if you have any of the following: headache, visual changes, fever >101.0 F, chills, shortness of breath, breast concerns, excessive vaginal bleeding, incision drainage or problems, leg pain or redness, depression or any other concerns. If you have vaginal discharge with an odor, let your doctor know.   It is normal to bleed for up to 6 weeks. You should not soak through more than 1 pad in 1 hour. If you have a blood clot larger than your fist with continued bleeding, call your doctor.   Activity: Do not lift > 10 lbs for 6 weeks (do not lift anything heavier than your baby). No intercourse, tampons, swimming pools, hot tubs, baths (only showers) for 6 weeks.  No driving for 1-2 weeks. Continue prenatal vitamin, especially if breastfeeding. Increase calories and fluids (water) while breastfeeding.   Your milk will come in, in the next couple of days (right now it is colostrum). You may have a slight fever when your milk comes in, but it should go away on its own.  If it does not, and rises above 101 F please call the doctor. You will also feel achy and your breasts will be firm. They will also start to leak. If you are breastfeeding, continue as you have been and you can pump/express milk for comfort.   If you have too much milk, your breasts can become engorged, which could lead to mastitis. This is an infection of the milk ducts. It can be very painful and you will need to notify your doctor to obtain a prescription for antibiotics. You can also treat it with a shower or hot/cold compress.   For concerns about your baby, please call your pediatrician.  For breastfeeding concerns, the lactation consultant can be reached at [phone].   Postpartum blues (feelings of happy one minute  and sad another minute) are normal for the first few weeks but if it gets worse let your doctor know.   Congratulations! We enjoyed caring for you!

## 2021-02-19 NOTE — Progress Notes (Signed)
Mother discharged.  Discharge instructions given.  Mother verbalizes understanding. Mom and dad are going to visit the baby before leaving.  She will notify RN when ready to be  transported by [facility].  Patient refused varicella vaccination prior to discharge.

## 2021-02-19 NOTE — Anesthesia Postprocedure Evaluation (Signed)
Anesthesia Post Note  Patient: Diane Morse  Procedure(s) Performed: AN AD HOC LABOR EPIDURAL  Patient location during evaluation: Mother Baby Anesthesia Type: Spinal Level of consciousness: awake and alert Pain management: pain level controlled Vital Signs Assessment: post-procedure vital signs reviewed and stable Respiratory status: spontaneous breathing, nonlabored ventilation and respiratory function stable Cardiovascular status: stable Postop Assessment: no headache and epidural receding Anesthetic complications: no Comments: C/o backache   No notable events documented.   Last Vitals:  Vitals:   02/19/21 0003 02/19/21 0345  BP: 114/74 118/81  Pulse: 72 69  Resp: 20 20  Temp: 36.9 C 36.7 C  SpO2: 96% 96%    Last Pain:  Vitals:   02/19/21 0609  TempSrc:   PainSc: 1                  Felicita Gage

## 2021-02-21 LAB — SURGICAL PATHOLOGY

## 2021-02-23 ENCOUNTER — Emergency Department: Payer: Medicaid Other

## 2021-02-23 ENCOUNTER — Emergency Department
Admission: EM | Admit: 2021-02-23 | Discharge: 2021-02-24 | Disposition: A | Discharge status: Home / Self Care | Payer: Medicaid Other | Attending: Emergency Medicine | Admitting: Emergency Medicine

## 2021-02-23 ENCOUNTER — Telehealth: Payer: Self-pay | Admitting: Obstetrics and Gynecology

## 2021-02-23 ENCOUNTER — Other Ambulatory Visit: Payer: Self-pay

## 2021-02-23 ENCOUNTER — Encounter: Payer: Self-pay | Admitting: Emergency Medicine

## 2021-02-23 DIAGNOSIS — O1494 Unspecified pre-eclampsia, complicating childbirth: Secondary | ICD-10-CM

## 2021-02-23 DIAGNOSIS — R079 Chest pain, unspecified: Secondary | ICD-10-CM | POA: Diagnosis not present

## 2021-02-23 DIAGNOSIS — R002 Palpitations: Secondary | ICD-10-CM | POA: Insufficient documentation

## 2021-02-23 DIAGNOSIS — R519 Headache, unspecified: Secondary | ICD-10-CM

## 2021-02-23 DIAGNOSIS — O152 Eclampsia in the puerperium: Secondary | ICD-10-CM | POA: Diagnosis not present

## 2021-02-23 LAB — URINALYSIS, COMPLETE (UACMP) WITH MICROSCOPIC
Bacteria, UA: NONE SEEN
Bilirubin Urine: NEGATIVE
Glucose, UA: NEGATIVE mg/dL
Ketones, ur: 5 mg/dL — AB
Nitrite: NEGATIVE
Protein, ur: 100 mg/dL — AB
Specific Gravity, Urine: 1.024 (ref 1.005–1.030)
pH: 6 (ref 5.0–8.0)

## 2021-02-23 LAB — CBC WITH DIFFERENTIAL/PLATELET
Abs Immature Granulocytes: 0.05 10*3/uL (ref 0.00–0.07)
Basophils Absolute: 0.1 10*3/uL (ref 0.0–0.1)
Basophils Relative: 1 %
Eosinophils Absolute: 0.4 10*3/uL (ref 0.0–0.5)
Eosinophils Relative: 4 %
HCT: 33.4 % — ABNORMAL LOW (ref 36.0–46.0)
Hemoglobin: 10.7 g/dL — ABNORMAL LOW (ref 12.0–15.0)
Immature Granulocytes: 1 %
Lymphocytes Relative: 30 %
Lymphs Abs: 3.3 10*3/uL (ref 0.7–4.0)
MCH: 27.2 pg (ref 26.0–34.0)
MCHC: 32 g/dL (ref 30.0–36.0)
MCV: 85 fL (ref 80.0–100.0)
Monocytes Absolute: 0.9 10*3/uL (ref 0.1–1.0)
Monocytes Relative: 8 %
Neutro Abs: 6.3 10*3/uL (ref 1.7–7.7)
Neutrophils Relative %: 56 %
Platelets: 376 10*3/uL (ref 150–400)
RBC: 3.93 MIL/uL (ref 3.87–5.11)
RDW: 14.3 % (ref 11.5–15.5)
WBC: 11 10*3/uL — ABNORMAL HIGH (ref 4.0–10.5)
nRBC: 0 % (ref 0.0–0.2)

## 2021-02-23 LAB — COMPREHENSIVE METABOLIC PANEL
ALT: 42 U/L (ref 0–44)
AST: 34 U/L (ref 15–41)
Albumin: 3.5 g/dL (ref 3.5–5.0)
Alkaline Phosphatase: 118 U/L (ref 38–126)
Anion gap: 7 (ref 5–15)
BUN: 12 mg/dL (ref 6–20)
CO2: 22 mmol/L (ref 22–32)
Calcium: 8.9 mg/dL (ref 8.9–10.3)
Chloride: 108 mmol/L (ref 98–111)
Creatinine, Ser: 0.55 mg/dL (ref 0.44–1.00)
GFR, Estimated: >60 mL/min (ref >60)
Glucose, Bld: 91 mg/dL (ref 70–99)
Potassium: 4 mmol/L (ref 3.5–5.1)
Sodium: 137 mmol/L (ref 135–145)
Total Bilirubin: 0.6 mg/dL (ref 0.3–1.2)
Total Protein: 7.1 g/dL (ref 6.5–8.1)

## 2021-02-23 LAB — TROPONIN I (HIGH SENSITIVITY): Troponin I (High Sensitivity): 8 ng/L (ref <18)

## 2021-02-23 MED ORDER — ACETAMINOPHEN 500 MG PO TABS
1000.0000 mg | ORAL_TABLET | Freq: Once | ORAL | Status: AC
  Administered 2021-02-23: 1000 mg via ORAL
  Filled 2021-02-23: qty 2

## 2021-02-23 MED ORDER — LABETALOL HCL 200 MG PO TABS
200.0000 mg | ORAL_TABLET | Freq: Once | ORAL | Status: AC
  Administered 2021-02-24: 200 mg via ORAL
  Filled 2021-02-23: qty 1

## 2021-02-23 NOTE — ED Provider Notes (Signed)
[facility] Emergency Department Provider Note  ____________________________________________   Event Date/Time   First MD Initiated Contact with Patient 02/23/21 2313     (approximate)  I have reviewed the triage vital signs and the nursing notes.   HISTORY  Chief Complaint Headache    HPI Diane Morse is a 25 y.o. female G1P0101 who presents to the emergency department for complaints of throbbing right-sided headache, heart racing, chest pain.  Patient induced for severe eclampsia at 34 and 4.  Underwent vaginal delivery.  Was on magnesium for 18 hours postpartum.  Discharged on Procardia 30 mg XL every morning which she has been taking.  Blood pressures had normalized on discharge and were in the 120s/80s in the clinic.  States she got a blood pressure cuff today and it has been elevated in the 140s to 150s/90s to 100s.  She states she has had this right-sided throbbing headache since prior to delivery and it has not significantly changed.  No vision changes.  No numbness, tingling or weakness.  Started feeling her heart was racing and states her chest "hurts" tonight.  No shortness of breath.  No fever.  She is currently breast-feeding.  Denies any breast redness but does feel engorged that she has not pumped in 3 hours.  No abdominal pain.  Vaginal bleeding slowly improving.  No discharge, foul odor.        Past Medical History:  Diagnosis Date   Medical history non-contributory     Patient Active Problem List   Diagnosis Date Noted   Pre-eclampsia 02/16/2021   Pre-eclampsia 02/16/2021    Past Surgical History:  Procedure Laterality Date   NO PAST SURGERIES      Prior to Admission medications   Medication Sig Start Date End Date Taking? Authorizing Provider  labetalol (NORMODYNE) 200 MG tablet Take 1 tablet (200 mg total) by mouth 2 (two) times daily. 02/24/21  Yes Vivian Neuwirth, Layla Maw, DO  acetaminophen (TYLENOL) 325 MG tablet Take 2 tablets  (650 mg total) by mouth every 4 (four) hours as needed (for pain scale < 4). 02/19/21   McVey, Prudencio Pair, CNM  benzocaine-Menthol (DERMOPLAST) 20-0.5 % AERO Apply 1 application topically as needed for irritation (perineal discomfort). 02/19/21   McVey, Prudencio Pair, CNM  coconut oil OIL Apply 1 application topically as needed. 02/19/21   McVey, Prudencio Pair, CNM  ferrous sulfate 325 (65 FE) MG tablet Take 1 tablet (325 mg total) by mouth 2 (two) times daily with a meal. 02/19/21   McVey, Prudencio Pair, CNM  hydrocortisone cream 1 % Apply topically 3 (three) times daily. 02/19/21   McVey, Prudencio Pair, CNM  ibuprofen (ADVIL) 600 MG tablet Take 1 tablet (600 mg total) by mouth every 6 (six) hours. 02/19/21   McVey, Prudencio Pair, CNM  ondansetron (ZOFRAN ODT) 4 MG disintegrating tablet Take 1 tablet (4 mg total) by mouth every 8 (eight) hours as needed for nausea or vomiting. 02/13/18   Irean Hong, MD  senna-docusate (SENOKOT-S) 8.6-50 MG tablet Take 2 tablets by mouth daily. 02/20/21   McVey, Prudencio Pair, CNM  simethicone (MYLICON) 80 MG chewable tablet Chew 1 tablet (80 mg total) by mouth as needed for flatulence. 02/19/21   McVey, Prudencio Pair, CNM  witch hazel-glycerin (TUCKS) pad Apply 1 application topically as needed for hemorrhoids. 02/19/21   McVey, Prudencio Pair, CNM    Allergies Patient has no known allergies.  No family history on file.  Social History Social History  Tobacco Use   Smoking status: Never   Smokeless tobacco: Never  Vaping Use   Vaping Use: Never used  Substance Use Topics   Alcohol use: No   Drug use: No    Review of Systems Constitutional: No fever. Eyes: No visual changes. ENT: No sore throat. Cardiovascular: + chest pain. Respiratory: Denies shortness of breath. Gastrointestinal: No nausea, vomiting, diarrhea. Genitourinary: Negative for dysuria. Musculoskeletal: Negative for back pain. Skin: Negative for rash. Neurological: Negative for focal weakness or  numbness.  ____________________________________________   PHYSICAL EXAM:  VITAL SIGNS: ED Triage Vitals  Enc Vitals Group     BP 02/23/21 2210 (!) 146/106     Pulse Rate 02/23/21 2210 84     Resp 02/23/21 2210 20     Temp 02/23/21 2210 98.3 F (36.8 C)     Temp Source 02/23/21 2210 Oral     SpO2 02/23/21 2210 97 %     Weight 02/23/21 2153 156 lb (70.8 kg)     Height 02/23/21 2153 5\' 2"  (1.575 m)     Head Circumference --      Peak Flow --      Pain Score 02/23/21 2153 5     Pain Loc --      Pain Edu? --      Excl. in GC? --    CONSTITUTIONAL: Alert and oriented and responds appropriately to questions. Well-appearing; well-nourished HEAD: Normocephalic, atraumatic EYES: Conjunctivae clear, pupils appear equal, EOM appear intact ENT: normal nose; moist mucous membranes NECK: Supple, normal ROM, no meningismus CARD: RRR; S1 and S2 appreciated; no murmurs, no clicks, no rubs, no gallops RESP: Normal chest excursion without splinting or tachypnea; breath sounds clear and equal bilaterally; no wheezes, no rhonchi, no rales, no hypoxia or respiratory distress, speaking full sentences BREAST:  No breast mass palpated on exam.  Both breasts are engorged with some leaking of colostrum from both nipples.  No erythema, warmth, induration, fluctuance.  No inverted nipple.  No skin changes.  No axillary lymphadenopathy. ABD/GI: Normal bowel sounds; non-distended; soft, non-tender, no rebound, no guarding, no peritoneal signs, no hepatosplenomegaly, uterus feels firm in the lower abdomen, no right upper quadrant tenderness BACK: The back appears normal EXT: Normal ROM in all joints; no deformity noted, no edema; no cyanosis SKIN: Normal color for age and race; warm; no rash on exposed skin NEURO: Moves all extremities equally, normal sensation diffusely, cranial nerves II through XII intact, normal speech, diffuse hyperreflexia without clonus PSYCH: The patient's mood and manner are  appropriate.  ____________________________________________   LABS (all labs ordered are listed, but only abnormal results are displayed)  Labs Reviewed  CBC WITH DIFFERENTIAL/PLATELET - Abnormal; Notable for the following components:      Result Value   WBC 11.0 (*)    Hemoglobin 10.7 (*)    HCT 33.4 (*)    All other components within normal limits  URINALYSIS, COMPLETE (UACMP) WITH MICROSCOPIC - Abnormal; Notable for the following components:   Color, Urine YELLOW (*)    APPearance HAZY (*)    Hgb urine dipstick LARGE (*)    Ketones, ur 5 (*)    Protein, ur 100 (*)    Leukocytes,Ua MODERATE (*)    All other components within normal limits  COMPREHENSIVE METABOLIC PANEL  TROPONIN I (HIGH SENSITIVITY)  TROPONIN I (HIGH SENSITIVITY)   ____________________________________________  EKG   EKG Interpretation  Date/Time:  Thursday February 23 2021 22:00:13 EDT Ventricular Rate:  79 PR Interval:  146 QRS Duration: 78 QT Interval:  362 QTC Calculation: 415 R Axis:   73 Text Interpretation: Normal sinus rhythm Normal ECG Confirmed by Rochele RaringWard, Averianna Brugger [phone]([id number]) on 02/23/2021 11:13:15 PM         ____________________________________________  RADIOLOGY Normajean BaxterI, Mischelle Reeg, personally viewed and evaluated these images (plain radiographs) as part of my medical decision making, as well as reviewing the written report by the radiologist.  ED MD interpretation: Chest x-ray clear.  Official radiology report(s): DG Chest 2 View  Result Date: 02/24/2021 CLINICAL DATA:  Chest pain, recent vaginal delivery 02/17/2021, preeclampsia EXAM: CHEST - 2 VIEW COMPARISON:  None. FINDINGS: The heart size and mediastinal contours are within normal limits. Both lungs are clear. The visualized skeletal structures are unremarkable. IMPRESSION: No active cardiopulmonary disease. Electronically Signed   By: Kreg ShropshirePrice  DeHay M.D.   On: 02/24/2021 00:21     ____________________________________________   PROCEDURES  Procedure(s) performed (including Critical Care):  Procedures   ____________________________________________   INITIAL IMPRESSION / ASSESSMENT AND PLAN / ED COURSE  As part of my medical decision making, I reviewed the following data within the electronic MEDICAL RECORD NUMBER History obtained from family, Nursing notes reviewed and incorporated, Labs reviewed , EKG interpreted , Old EKG reviewed, Old chart reviewed, Radiograph reviewed , A consult was requested and obtained from this/these consultant(s) OBGYN, and Notes from prior ED visits         Patient 6 days postpartum after induction, vaginal delivery for preeclampsia.  On Procardia since discharge.  Did receive 18 hours of magnesium postpartum.  Reports elevated blood pressures at home today when she was checking with her cuff that she just purchased today.  Reports right-sided headache that has been present since prior to delivery and unchanged.  Also having palpitations feeling her heart is racing and states this is causing her chest to hurt.  She denies any pleuritic chest pain or shortness of breath.  We did discuss that PE is on the differential given recent pregnancy and hospitalization however she is not tachycardic, tachypneic or hypoxic and it seems palpitations are more her concern but her heart rate is currently in the 80s in a sinus rhythm.  Work-up obtained in triage reveals normal platelets, normal LFTs.  She does have proteinuria.  He has 1 normal troponin.  Electrolytes within normal limits.  She request Tylenol for her headache.  Will discuss with OB on-call.  ED PROGRESS  Discussed with Heloise Ochoaebecca McVey[facility]  She did discuss the case with Dr. Dalbert GarnetBeasley, on-call OB attending.  Given patient's blood pressures have not been any higher than 150s/100s, we do not need to give him IV magnesium at this time.  They do recommend changing her  Procardia to labetalol 200 mg twice daily.  Will give first dose here in the emergency department.  Patient also comfortable with this plan.    Patient's blood pressure has already come down prior to receiving blood pressure medications and is 134/92.  She is in no distress and currently pumping.  Husband at bedside has been updated with plan.   12:40 AM  Pt's headache has improved with Tylenol.  Will give Toradol for residual headache.  Blood pressure still in the 130s/90s.  Second troponin pending.   1:05 AM  Pt reports feeling much better after Toradol.  Headache almost completely resolved.  No current chest pain.  Heart rate in the 70s.  Blood pressure currently 125/99.  Will discharge with prescription of labetalol.  Recommended close follow-up with her OB/GYN on Monday morning.  Discussed at length return precautions.  She has a blood pressure cuff to monitor her blood pressures at home.  Patient and husband verbalized understanding.  Recommended alternating Tylenol, Motrin for pain.   At this time, I do not feel there is any life-threatening condition present. I have reviewed, interpreted and discussed all results (EKG, imaging, lab, urine as appropriate) and exam findings with patient/family. I have reviewed nursing notes and appropriate previous records.  I feel the patient is safe to be discharged home without further emergent workup and can continue workup as an outpatient as needed. Discussed usual and customary return precautions. Patient/family verbalize understanding and are comfortable with this plan.  Outpatient follow-up has been provided as needed. All questions have been answered.  ____________________________________________   FINAL CLINICAL IMPRESSION(S) / ED DIAGNOSES  Final diagnoses:  Pre-eclampsia, delivered  Right-sided headache  Palpitations     ED Discharge Orders          Ordered    labetalol (NORMODYNE) 200 MG tablet  2 times daily        02/24/21 0109             *Please note:  Diane Morse was evaluated in Emergency Department on 02/24/2021 for the symptoms described in the history of present illness. She was evaluated in the context of the global COVID-19 pandemic, which necessitated consideration that the patient might be at risk for infection with the SARS-CoV-2 virus that causes COVID-19. Institutional protocols and algorithms that pertain to the evaluation of patients at risk for COVID-19 are in a state of rapid change based on information released by regulatory bodies including the CDC and federal and state organizations. These policies and algorithms were followed during the patient's care in the ED.  Some ED evaluations and interventions may be delayed as a result of limited staffing during and the pandemic.*   Note:  This document was prepared using Dragon voice recognition software and may include unintentional dictation errors.    Elaine Middleton, Layla Maw, DO 02/24/21 0128

## 2021-02-23 NOTE — ED Triage Notes (Addendum)
Patient ambulatory to triage with steady gait, without difficulty or distress noted; pt reports vag delivery 6/10 with induction r/t pre-eclampsia; c/o frontal HA x 3 days, BP 140/100 PTA and sensation heart racing; currently taking procardia daily

## 2021-02-23 NOTE — Telephone Encounter (Signed)
25 y.o. G1P0101 at 6days PP, induction for severe preeclampsia at 34.[redacted]wks gestation.   Pt called with severe HA, and BP 140/100 and feels like heart is racing. Taking Procardia 30mg  XL qAM for postpartum hypertension.   Seen in clinic on 02/21/21 with BP 129/84.   Pt is checking into ER for eval due to unremitting HA and elevated HR with HTN.   02/23/21, CNM 02/23/2021 9:45 PM

## 2021-02-24 ENCOUNTER — Emergency Department: Payer: Medicaid Other

## 2021-02-24 LAB — TROPONIN I (HIGH SENSITIVITY): Troponin I (High Sensitivity): 8 ng/L (ref <18)

## 2021-02-24 MED ORDER — LABETALOL HCL 200 MG PO TABS: 200.0000 mg | ORAL_TABLET | Freq: Two times a day (BID) | ORAL | 0 refills | Status: DC

## 2021-02-24 MED ORDER — KETOROLAC TROMETHAMINE 30 MG/ML IJ SOLN
30.0000 mg | Freq: Once | INTRAMUSCULAR | Status: AC
  Administered 2021-02-24: 30 mg via INTRAVENOUS
  Filled 2021-02-24: qty 1

## 2021-02-24 NOTE — ED Notes (Signed)
Patient transported to X-ray 

## 2021-02-24 NOTE — Discharge Instructions (Addendum)
You may alternate Tylenol 1000 mg every 6 hours as needed for pain, fever and Ibuprofen 800 mg every 8 hours as needed for pain, fever.  Please take Ibuprofen with food.  Do not take more than 4000 mg of Tylenol (acetaminophen) in a 24 hour period.  Please call your OB/GYN in the morning to schedule close outpatient follow-up.  Please stop taking Procardia and begin taking labetalol 200 mg twice a day.   I recommend monitoring your blood pressure at least twice a day.  If your blood pressures consistently stay over the 140s/90s, please call your OB/GYN for further recommendations.  If your blood pressure stays over the 160/110's or you have severe headache, vision changes, chest pain or shortness of breath, swelling, unable to urinate for several hours, right upper abdominal pain, please return to the emergency department.

## 2021-02-27 ENCOUNTER — Ambulatory Visit: Payer: Self-pay

## 2021-02-27 NOTE — Lactation Note (Signed)
This note was copied from a baby's chart. Lactation Consultation Note  Patient Name: Diane Morse [id number]'[id number] Date: 02/27/2021 Reason for consult: Follow-up assessment;Primapara;NICU baby;Late-preterm 34-36.6wks;Infant < 6lbs Age:26 days  Lactation at bed space 06 in SCN for assistance with feeding at the breast.  Mom reports baby tolerating bottle feedings well, and she has brought baby to breast 2 different times before today- mainly for lick and learns. Mom has not pumped since 11:30am (prior to her MD appointment), breasts are full, harder, and slightly tender to touch.  Baby brought to mom in football hold with support pillows, attempts made to latch, baby having difficulty sustaining latch initially. Size 70mm NS placed; baby accepted and sustained latch with gulping and audible swallows throughout 13 minute feeding. Baby tolerated feeding well, maintained stats, and fell asleep at the breast.  Baby brought to mom's chest, LC notes breast milk in shield as positive reinforcement that baby did well. Encouraged efforts at the breast each time mom is present for baby to have as much practice and build strength.  Mom encouraged to pump to relieve breast tightness, especially in opposite side that baby did not feed on.   Encouraged to call for lactation support at future feeding attempts/efforts.  Maternal Data Has patient been taught Hand Expression?: Yes Does the patient have breastfeeding experience prior to this delivery?: No  Feeding Mother's Current Feeding Choice: Breast Milk  LATCH Score Latch: Grasps breast easily, tongue down, lips flanged, rhythmical sucking.  Audible Swallowing: Spontaneous and intermittent  Type of Nipple: Everted at rest and after stimulation  Comfort (Breast/Nipple): Soft / non-tender  Hold (Positioning): Assistance needed to correctly position infant at breast and maintain latch.  LATCH Score: 9   Lactation Tools Discussed/Used Tools: Nipple  Shields Nipple shield size: 20 Breast pump type: Double-Electric Breast Pump Reason for Pumping: NICU  Interventions Interventions: Breast feeding basics reviewed;Assisted with latch;Hand express;Adjust position;Support pillows;Education (nipple shield)  Discharge WIC Program: Yes  Consult Status Consult Status: Follow-up Date: 02/27/21 Follow-up type: Call as needed    Danford Bad 02/27/2021, 3:34 PM

## 2022-06-08 ENCOUNTER — Encounter: Payer: Self-pay | Admitting: Emergency Medicine

## 2022-06-08 ENCOUNTER — Other Ambulatory Visit: Payer: Self-pay

## 2022-06-08 DIAGNOSIS — R7401 Elevation of levels of liver transaminase levels: Secondary | ICD-10-CM | POA: Insufficient documentation

## 2022-06-08 DIAGNOSIS — Z20822 Contact with and (suspected) exposure to covid-19: Secondary | ICD-10-CM | POA: Diagnosis not present

## 2022-06-08 DIAGNOSIS — E876 Hypokalemia: Secondary | ICD-10-CM | POA: Diagnosis not present

## 2022-06-08 DIAGNOSIS — R1084 Generalized abdominal pain: Secondary | ICD-10-CM | POA: Diagnosis present

## 2022-06-08 DIAGNOSIS — N390 Urinary tract infection, site not specified: Secondary | ICD-10-CM | POA: Insufficient documentation

## 2022-06-08 NOTE — ED Triage Notes (Signed)
Pt in via POV, reports RUQ abdominal pain, worsening over the last week, states pain is worse after she eats.    Ambulatory to triage, NAD noted at this time.

## 2022-06-09 ENCOUNTER — Emergency Department
Admission: EM | Admit: 2022-06-09 | Discharge: 2022-06-09 | Disposition: A | Discharge status: Home / Self Care | Payer: BC Managed Care – PPO | Attending: Emergency Medicine | Admitting: Emergency Medicine

## 2022-06-09 ENCOUNTER — Emergency Department: Payer: BC Managed Care – PPO

## 2022-06-09 DIAGNOSIS — N390 Urinary tract infection, site not specified: Secondary | ICD-10-CM

## 2022-06-09 DIAGNOSIS — K824 Cholesterolosis of gallbladder: Secondary | ICD-10-CM

## 2022-06-09 DIAGNOSIS — E876 Hypokalemia: Secondary | ICD-10-CM

## 2022-06-09 DIAGNOSIS — R1084 Generalized abdominal pain: Secondary | ICD-10-CM

## 2022-06-09 LAB — URINALYSIS, ROUTINE W REFLEX MICROSCOPIC
Bilirubin Urine: NEGATIVE
Glucose, UA: NEGATIVE mg/dL
Hgb urine dipstick: NEGATIVE
Ketones, ur: NEGATIVE mg/dL
Nitrite: NEGATIVE
Protein, ur: 30 mg/dL — AB
Specific Gravity, Urine: 1.017 (ref 1.005–1.030)
pH: 6 (ref 5.0–8.0)

## 2022-06-09 LAB — COMPREHENSIVE METABOLIC PANEL
ALT: 47 U/L — ABNORMAL HIGH (ref 0–44)
AST: 63 U/L — ABNORMAL HIGH (ref 15–41)
Albumin: 4.3 g/dL (ref 3.5–5.0)
Alkaline Phosphatase: 88 U/L (ref 38–126)
Anion gap: 8 (ref 5–15)
BUN: 9 mg/dL (ref 6–20)
CO2: 25 mmol/L (ref 22–32)
Calcium: 9.1 mg/dL (ref 8.9–10.3)
Chloride: 105 mmol/L (ref 98–111)
Creatinine, Ser: 0.66 mg/dL (ref 0.44–1.00)
GFR, Estimated: >60 mL/min (ref >60)
Glucose, Bld: 110 mg/dL — ABNORMAL HIGH (ref 70–99)
Potassium: 3.3 mmol/L — ABNORMAL LOW (ref 3.5–5.1)
Sodium: 138 mmol/L (ref 135–145)
Total Bilirubin: 0.8 mg/dL (ref 0.3–1.2)
Total Protein: 7.9 g/dL (ref 6.5–8.1)

## 2022-06-09 LAB — CBC
HCT: 39.5 % (ref 36.0–46.0)
Hemoglobin: 13 g/dL (ref 12.0–15.0)
MCH: 28.1 pg (ref 26.0–34.0)
MCHC: 32.9 g/dL (ref 30.0–36.0)
MCV: 85.5 fL (ref 80.0–100.0)
Platelets: 205 10*3/uL (ref 150–400)
RBC: 4.62 MIL/uL (ref 3.87–5.11)
RDW: 11.5 % (ref 11.5–15.5)
WBC: 6.8 10*3/uL (ref 4.0–10.5)
nRBC: 0 % (ref 0.0–0.2)

## 2022-06-09 LAB — RESP PANEL BY RT-PCR (FLU A&B, COVID) ARPGX2
Influenza A by PCR: NEGATIVE
Influenza B by PCR: NEGATIVE
SARS Coronavirus 2 by RT PCR: NEGATIVE

## 2022-06-09 LAB — PREGNANCY, URINE: Preg Test, Ur: NEGATIVE

## 2022-06-09 LAB — LIPASE, BLOOD: Lipase: 34 U/L (ref 11–51)

## 2022-06-09 MED ORDER — IOHEXOL 300 MG/ML  SOLN
100.0000 mL | Freq: Once | INTRAMUSCULAR | Status: AC | PRN
  Administered 2022-06-09: 100 mL via INTRAVENOUS

## 2022-06-09 MED ORDER — CEPHALEXIN 500 MG PO CAPS: 500.0000 mg | ORAL_CAPSULE | Freq: Two times a day (BID) | ORAL | 0 refills | Status: DC

## 2022-06-09 MED ORDER — FAMOTIDINE 20 MG PO TABS
20.0000 mg | ORAL_TABLET | Freq: Once | ORAL | Status: AC
  Administered 2022-06-09: 20 mg via ORAL
  Filled 2022-06-09: qty 1

## 2022-06-09 MED ORDER — ONDANSETRON HCL 4 MG/2ML IJ SOLN
4.0000 mg | Freq: Once | INTRAMUSCULAR | Status: AC
  Administered 2022-06-09: 4 mg via INTRAVENOUS
  Filled 2022-06-09: qty 2

## 2022-06-09 MED ORDER — SODIUM CHLORIDE 0.9 % IV SOLN
1.0000 g | Freq: Once | INTRAVENOUS | Status: AC
  Administered 2022-06-09: 1 g via INTRAVENOUS
  Filled 2022-06-09: qty 10

## 2022-06-09 MED ORDER — FAMOTIDINE IN NACL 20-0.9 MG/50ML-% IV SOLN
20.0000 mg | Freq: Once | INTRAVENOUS | Status: AC
  Administered 2022-06-09: 20 mg via INTRAVENOUS
  Filled 2022-06-09: qty 50

## 2022-06-09 MED ORDER — SODIUM CHLORIDE 0.9 % IV BOLUS
500.0000 mL | Freq: Once | INTRAVENOUS | Status: AC
  Administered 2022-06-09: 500 mL via INTRAVENOUS

## 2022-06-09 MED ORDER — SODIUM CHLORIDE 0.9 % IV BOLUS: 1000.0000 mL | Freq: Once | INTRAVENOUS | Status: DC

## 2022-06-09 MED ORDER — ONDANSETRON 4 MG PO TBDP: 4.0000 mg | ORAL_TABLET | Freq: Four times a day (QID) | ORAL | 0 refills | Status: DC | PRN

## 2022-06-09 MED ORDER — POTASSIUM CHLORIDE 10 MEQ/100ML IV SOLN
10.0000 meq | Freq: Once | INTRAVENOUS | Status: AC
  Administered 2022-06-09: 10 meq via INTRAVENOUS
  Filled 2022-06-09: qty 100

## 2022-06-09 MED ORDER — POTASSIUM CHLORIDE CRYS ER 20 MEQ PO TBCR
20.0000 meq | EXTENDED_RELEASE_TABLET | Freq: Once | ORAL | Status: AC
  Administered 2022-06-09: 20 meq via ORAL
  Filled 2022-06-09: qty 1

## 2022-06-09 MED ORDER — ALUM & MAG HYDROXIDE-SIMETH 200-200-20 MG/5ML PO SUSP
15.0000 mL | Freq: Once | ORAL | Status: AC
  Administered 2022-06-09: 15 mL via ORAL
  Filled 2022-06-09: qty 30

## 2022-06-09 MED ORDER — KETOROLAC TROMETHAMINE 30 MG/ML IJ SOLN
15.0000 mg | Freq: Once | INTRAMUSCULAR | Status: AC
  Administered 2022-06-09: 15 mg via INTRAVENOUS
  Filled 2022-06-09: qty 1

## 2022-06-09 MED ORDER — MORPHINE SULFATE (PF) 2 MG/ML IV SOLN
2.0000 mg | Freq: Once | INTRAVENOUS | Status: AC
  Administered 2022-06-09: 2 mg via INTRAVENOUS
  Filled 2022-06-09: qty 1

## 2022-06-09 NOTE — ED Notes (Signed)
1L of fluid started with K+ bag

## 2022-06-09 NOTE — ED Notes (Signed)
Pt taken to CT.

## 2022-06-09 NOTE — ED Notes (Signed)
Pt A&O, IV removed, pt given discharge instructions, pt ambulating with steady gait. 

## 2022-06-09 NOTE — ED Notes (Signed)
Report given to Lovena Le and Gregary Signs RN

## 2022-06-09 NOTE — ED Provider Notes (Signed)
Vitals:   06/09/22 0930 06/09/22 1030  BP: 97/74 104/78  Pulse: 89 87  Resp: 16 20  Temp: 98 F (36.7 C)   SpO2: 97%      Patient resting comfortably at this time.  Reports that she feels improved after additional fluids and Toradol.  Fully alert well oriented.  Discussed that her work-up seems indicated urinary tract infection, and she is agreeable with plan for antibiotics, Zofran, and also advises that she is a history of acid reflux which she takes Tums for and it seems to be acting up a little bit right now.  We discussed perhaps trial of over-the-counter medication like Pepcid which she will pick up at the pharmacy, and careful return precautions are advised.  She appears well pain well controlled, very pleasant, and capable of follow-up with careful return precautions to return to the ED with as well.  Also advised the recommendation to follow-up with our surgeons in about 6 months time to have the gallbladder polyps that are seen reevaluated with a repeat ultrasound.  Return precautions and treatment recommendations and follow-up discussed with the patient who is agreeable with the plan.   CT Abdomen Pelvis W Contrast  Result Date: 06/09/2022 CLINICAL DATA:  Acute, nonlocalized abdominal pain. Pain in the upper abdomen with fever, nausea, vomiting, and diarrhea. Symptoms on and off for 1 month EXAM: CT ABDOMEN AND PELVIS WITH CONTRAST TECHNIQUE: Multidetector CT imaging of the abdomen and pelvis was performed using the standard protocol following bolus administration of intravenous contrast. RADIATION DOSE REDUCTION: This exam was performed according to the departmental dose-optimization program which includes automated exposure control, adjustment of the mA and/or kV according to patient size and/or use of iterative reconstruction technique. CONTRAST:  OMNIPAQUE IOHEXOL 300 MG/ML  SOLN COMPARISON:  02/13/2018 FINDINGS: Lower chest:  No contributory findings. Hepatobiliary: No focal  liver abnormality.No evidence of biliary obstruction or stone. Pancreas: Unremarkable. Spleen: Unremarkable. Adrenals/Urinary Tract: Negative adrenals. No hydronephrosis or stone. Unremarkable bladder. Stomach/Bowel:  No obstruction. No appendicitis. Vascular/Lymphatic: No acute vascular abnormality. No mass or adenopathy. Reproductive:No pathologic findings.  Corpus luteum on the left. Other: No ascites or pneumoperitoneum. Trace pelvic fluid, usually physiologic. Musculoskeletal: No acute abnormalities. IMPRESSION: No acute finding or explanation for symptoms. Electronically Signed   By: Tiburcio Pea M.D.   On: 06/09/2022 08:34   DG Chest 2 View  Result Date: 06/09/2022 CLINICAL DATA:  Cough. EXAM: CHEST - 2 VIEW COMPARISON:  02/24/2021 FINDINGS: The heart size and mediastinal contours are within normal limits. Both lungs are clear. The visualized skeletal structures are unremarkable. IMPRESSION: No active cardiopulmonary disease. Electronically Signed   By: Signa Kell M.D.   On: 06/09/2022 06:30   US ABDOMEN LIMITED RUQ (LIVER/GB)  Result Date: 06/09/2022 CLINICAL DATA:  Right upper quadrant pain EXAM: ULTRASOUND ABDOMEN LIMITED RIGHT UPPER QUADRANT COMPARISON:  None Available. FINDINGS: Gallbladder: Non mobile echogenic foci within the gallbladder, which measure up to 5 mm, possibly polyps. No gallstones or wall thickening visualized. No sonographic Murphy sign noted by sonographer. Common bile duct: Diameter: 2 mm.  No intrahepatic biliary ductal dilatation. Liver: No focal lesion identified. Within normal limits in parenchymal echogenicity. Portal vein is patent on color Doppler imaging with normal direction of blood flow towards the liver. Other: None. IMPRESSION: 1. Gallbladder polyps measuring up to 5 mm. Recommend follow-up ultrasound in 6 months to evaluate for stability. 2. Otherwise unremarkable right upper quadrant ultrasound. Electronically Signed   By: Elaina Pattee.D.  On:  06/09/2022 04:02      Delman Kitten, MD 06/09/22 1211

## 2022-06-09 NOTE — ED Provider Notes (Signed)
[facility] Provider Note    Event Date/Time   First MD Initiated Contact with Patient 06/09/22 [phone]     (approximate)   History   Abdominal Pain   HPI  Diane Morse is a 26 y.o. female who presents to the ED from home with a chief complaint of generalized abdominal pain particularly in upper abdomen, fevers, nausea/vomiting and diarrhea.  Symptoms off and on for 1 month.  When symptoms first began, patient endorses 3 days of diarrhea.  Mid month she traveled to [place] and returned approximately 2 weeks ago.  For 3 days after her return she had fever with nausea/vomiting/diarrhea.  Fever and vomiting/diarrhea have resolved but pain persists.  So endorses cough.  Denies chest pain, shortness of breath, dysuria.     Past Medical History   Past Medical History:  Diagnosis Date   Medical history non-contributory      Active Problem List   Patient Active Problem List   Diagnosis Date Noted   Pre-eclampsia 02/16/2021   Pre-eclampsia 02/16/2021     Past Surgical History   Past Surgical History:  Procedure Laterality Date   NO PAST SURGERIES       Home Medications   Prior to Admission medications   Medication Sig Start Date End Date Taking? Authorizing Provider  acetaminophen (TYLENOL) 325 MG tablet Take 2 tablets (650 mg total) by mouth every 4 (four) hours as needed (for pain scale < 4). 02/19/21   McVey, Prudencio Pair, CNM  benzocaine-Menthol (DERMOPLAST) 20-0.5 % AERO Apply 1 application topically as needed for irritation (perineal discomfort). 02/19/21   McVey, Prudencio Pair, CNM  coconut oil OIL Apply 1 application topically as needed. 02/19/21   McVey, Prudencio Pair, CNM  ferrous sulfate 325 (65 FE) MG tablet Take 1 tablet (325 mg total) by mouth 2 (two) times daily with a meal. 02/19/21   McVey, Prudencio Pair, CNM  hydrocortisone cream 1 % Apply topically 3 (three) times daily. 02/19/21   McVey, Prudencio Pair, CNM  ibuprofen (ADVIL) 600 MG tablet Take 1  tablet (600 mg total) by mouth every 6 (six) hours. 02/19/21   McVey, Prudencio Pair, CNM  labetalol (NORMODYNE) 200 MG tablet Take 1 tablet (200 mg total) by mouth 2 (two) times daily. 02/24/21   Ward, Layla Maw, DO  ondansetron (ZOFRAN ODT) 4 MG disintegrating tablet Take 1 tablet (4 mg total) by mouth every 8 (eight) hours as needed for nausea or vomiting. 02/13/18   Irean Hong, MD  senna-docusate (SENOKOT-S) 8.6-50 MG tablet Take 2 tablets by mouth daily. 02/20/21   McVey, Prudencio Pair, CNM  simethicone (MYLICON) 80 MG chewable tablet Chew 1 tablet (80 mg total) by mouth as needed for flatulence. 02/19/21   McVey, Prudencio Pair, CNM  witch hazel-glycerin (TUCKS) pad Apply 1 application topically as needed for hemorrhoids. 02/19/21   McVey, Prudencio Pair, CNM     Allergies  Patient has no known allergies.   Family History  No family history on file.   Physical Exam  Triage Vital Signs: ED Triage Vitals  Enc Vitals Group     BP 06/09/22 0116 (!) 117/96     Pulse Rate 06/09/22 0116 (!) 109     Resp 06/09/22 0116 18     Temp 06/09/22 0116 98.2 F (36.8 C)     Temp Source 06/09/22 0116 Oral     SpO2 06/09/22 0116 99 %     Weight 06/08/22 2351 135 lb (61.2 kg)  Height 06/08/22 2351 5\' 2"  (1.575 m)     Head Circumference --      Peak Flow --      Pain Score 06/08/22 2350 10     Pain Loc --      Pain Edu? --      Excl. in [place]? --     Updated Vital Signs: BP 104/74   Pulse 91   Temp 98.1 F (36.7 C) (Oral)   Resp 18   Ht 5\' 2"  (1.575 m)   Wt 61.2 kg   LMP 05/13/2022 (Approximate)   SpO2 97%   BMI 24.69 kg/m    General: Awake, mild distress.  CV:  RRR.  Good peripheral perfusion.  Resp:  Normal effort.  CTA B. Abd:  Mild diffuse tenderness to palpation, maximal in epigastrium without rebound or guarding.  No distention.  Other:  No truncal vesicles.   ED Results / Procedures / Treatments  Labs (all labs ordered are listed, but only abnormal results are displayed) Labs Reviewed   COMPREHENSIVE METABOLIC PANEL - Abnormal; Notable for the following components:      Result Value   Potassium 3.3 (*)    Glucose, Bld 110 (*)    AST 63 (*)    ALT 47 (*)    All other components within normal limits  URINALYSIS, ROUTINE W REFLEX MICROSCOPIC - Abnormal; Notable for the following components:   Color, Urine YELLOW (*)    APPearance HAZY (*)    Protein, ur 30 (*)    Leukocytes,Ua MODERATE (*)    Bacteria, UA RARE (*)    All other components within normal limits  URINE CULTURE  RESP PANEL BY RT-PCR (FLU A&B, COVID) ARPGX2  LIPASE, BLOOD  CBC  PREGNANCY, URINE  POC URINE PREG, ED     EKG  None   RADIOLOGY I have independently visualized and interpreted patient's ultrasound and chest x-ray as well as noted the radiology interpretation:  Ultrasound: Gallbladder polyps otherwise unremarkable  Chest x-ray: No acute cardiopulmonary process  CT abdomen/pelvis is pending  Official radiology report(s): DG Chest 2 View  Result Date: 06/09/2022 CLINICAL DATA:  Cough. EXAM: CHEST - 2 VIEW COMPARISON:  02/24/2021 FINDINGS: The heart size and mediastinal contours are within normal limits. Both lungs are clear. The visualized skeletal structures are unremarkable. IMPRESSION: No active cardiopulmonary disease. Electronically Signed   By: Signa Kell M.D.   On: 06/09/2022 06:30   US ABDOMEN LIMITED RUQ (LIVER/GB)  Result Date: 06/09/2022 CLINICAL DATA:  Right upper quadrant pain EXAM: ULTRASOUND ABDOMEN LIMITED RIGHT UPPER QUADRANT COMPARISON:  None Available. FINDINGS: Gallbladder: Non mobile echogenic foci within the gallbladder, which measure up to 5 mm, possibly polyps. No gallstones or wall thickening visualized. No sonographic Murphy sign noted by sonographer. Common bile duct: Diameter: 2 mm.  No intrahepatic biliary ductal dilatation. Liver: No focal lesion identified. Within normal limits in parenchymal echogenicity. Portal vein is patent on color Doppler imaging  with normal direction of blood flow towards the liver. Other: None. IMPRESSION: 1. Gallbladder polyps measuring up to 5 mm. Recommend follow-up ultrasound in 6 months to evaluate for stability. 2. Otherwise unremarkable right upper quadrant ultrasound. Electronically Signed   By: Wiliam Ke M.D.   On: 06/09/2022 04:02     PROCEDURES:  Critical Care performed: No  .1-3 Lead EKG Interpretation  Performed by: Irean Hong, MD Authorized by: Irean Hong, MD     Interpretation: normal     ECG rate:  90   ECG rate assessment: normal     Rhythm: sinus rhythm     Ectopy: none     Conduction: normal   Comments:     Patient placed on cardiac monitor to evaluate for arrhythmias    MEDICATIONS ORDERED IN ED: Medications  potassium chloride 10 mEq in 100 mL IVPB (10 mEq Intravenous New Bag/Given 06/09/22 0626)  cefTRIAXone (ROCEPHIN) 1 g in sodium chloride 0.9 % 100 mL IVPB (1 g Intravenous New Bag/Given 06/09/22 0643)  famotidine (PEPCID) IVPB 20 mg premix (20 mg Intravenous New Bag/Given 06/09/22 0625)  ondansetron (ZOFRAN) injection 4 mg (4 mg Intravenous Given 06/09/22 0625)  morphine (PF) 2 MG/ML injection 2 mg (2 mg Intravenous Given 06/09/22 0624)     IMPRESSION / MDM / ASSESSMENT AND PLAN / ED COURSE  I reviewed the triage vital signs and the nursing notes.                             27 year old female presenting with a 1 month history of off/on abdominal pain, fevers, nausea/vomiting/diarrhea; midmonth travel to [place]. Differential diagnosis includes, but is not limited to, ovarian cyst, ovarian torsion, acute appendicitis, diverticulitis, urinary tract infection/pyelonephritis, endometriosis, bowel obstruction, colitis, renal colic, gastroenteritis, hernia, fibroids, endometriosis, pregnancy related pain including ectopic pregnancy, etc. I have personally reviewed patient's records and note a GYN visit from 03/31/2021 for pelvic pain.  Patient's presentation is most consistent with  acute presentation with potential threat to life or bodily function.  The patient is on the cardiac monitor to evaluate for evidence of arrhythmia and/or significant heart rate changes.  Laboratory results demonstrate normal WBC 6.8, mild hypokalemia with potassium 3.3, minimally elevated transaminases.  UA positive for moderate leukocytes with 11-20 WBCs.  Ultrasound unremarkable other than gallbladder polyps.  Will administer IV morphine for pain paired with IV Zofran for nausea, IV Pepcid for complaints of burning epigastric discomfort, replete potassium via IV, start IVs Rocephin for UTI.  Will obtain respiratory panel and chest x-ray given fevers with cough.  We will proceed with CT abdomen/pelvis to evaluate for intra-abdominal etiology of patient's symptoms.  Clinical Course as of 06/09/22 4196  Sat Jun 09, 2022  0654 Patients resting in no acute distress. Awaiting results of Covid and CT scan. Care transferred to Dr. Jacqualine Code at change of shift. [JS]    Clinical Course User Index [JS] Paulette Blanch, MD     FINAL CLINICAL IMPRESSION(S) / ED DIAGNOSES   Final diagnoses:  Generalized abdominal pain  Hypokalemia  Lower urinary tract infectious disease     Rx / DC Orders   ED Discharge Orders     None        Note:  This document was prepared using Dragon voice recognition software and may include unintentional dictation errors.   Paulette Blanch, MD 06/09/22 [phone]

## 2022-06-12 LAB — URINE CULTURE

## 2022-08-05 ENCOUNTER — Ambulatory Visit
Admission: EM | Admit: 2022-08-05 | Discharge: 2022-08-05 | Disposition: A | Discharge status: Home / Self Care | Payer: Medicaid Other

## 2022-08-05 DIAGNOSIS — H6993 Unspecified Eustachian tube disorder, bilateral: Secondary | ICD-10-CM

## 2022-08-05 DIAGNOSIS — O26843 Uterine size-date discrepancy, third trimester: Secondary | ICD-10-CM

## 2022-08-05 DIAGNOSIS — H8113 Benign paroxysmal vertigo, bilateral: Secondary | ICD-10-CM | POA: Diagnosis not present

## 2022-08-05 HISTORY — DX: Uterine size-date discrepancy, third trimester: O26.843

## 2022-08-05 MED ORDER — MECLIZINE HCL 12.5 MG PO TABS: ORAL_TABLET | ORAL | 0 refills | Status: DC

## 2022-08-05 MED ORDER — PREDNISONE 10 MG (21) PO TBPK: ORAL_TABLET | Freq: Every day | ORAL | 0 refills | Status: DC

## 2022-08-05 NOTE — Discharge Instructions (Signed)
Follow up with evaluation by Ear Nose and Throat provider if your symptoms are worsening or not improving with treatment.

## 2022-08-05 NOTE — ED Triage Notes (Signed)
Pt.presents to UC w/ c/o dizziness and nausea that started thanksgiving day. Pt. States she is unable to lean because it will case her to fall.Pt. also states on thanksgiving day she fainted.

## 2022-08-05 NOTE — ED Provider Notes (Signed)
[facility]    CSN: [id number] Arrival date & time: 08/05/22  1157      History   Chief Complaint Chief Complaint  Patient presents with   Dizziness   Nausea    HPI Shyia Fillingim is a 26 y.o. female.    Dizziness   Presents to urgent care with complaint of nausea and dizziness starting Thanksgiving day (3 days).  Patient indicates inability to lean over because she feels like she will fall.  She states that she fainted on Thanksgiving.  Past Medical History:  Diagnosis Date   Medical history non-contributory     Patient Active Problem List   Diagnosis Date Noted   Uterine size date discrepancy pregnancy, third trimester 08/05/2022   Pre-eclampsia 02/16/2021   Pre-eclampsia 02/16/2021    Past Surgical History:  Procedure Laterality Date   NO PAST SURGERIES      OB History     Gravida  1   Para  1   Term      Preterm  1   AB      Living  1      SAB      IAB      Ectopic      Multiple  0   Live Births  1            Home Medications    Prior to Admission medications   Medication Sig Start Date End Date Taking? Authorizing Provider  aspirin EC 81 MG tablet Take by mouth. 10/17/20  Yes [provider]  Blood Pressure Monitoring (SELF-TAKING BLOOD PRESSURE) KIT To monitor blood pressure at home during pregnancy. 02/21/21  Yes [provider]  Doxylamine-Pyridoxine 10-10 MG TBEC 2 Tablets ay night.  Add 1 tablet on day 3 in the morning if symptoms continue.  If still symptomatic on day 4 add 1 tablet in the afternoon. 01/19/21  Yes [provider]  NIFEdipine (ADALAT CC) 30 MG 24 hr tablet Take 1 tablet by mouth daily. 02/20/21  Yes [provider]  promethazine (PHENERGAN) 25 MG tablet Take by mouth. 10/17/20  Yes [provider]  acetaminophen (TYLENOL) 325 MG tablet Take 2 tablets (650 mg total) by mouth every 4 (four) hours as needed (for pain scale < 4). 02/19/21   McVey, Murray Hodgkins, CNM   benzocaine-Menthol (DERMOPLAST) 20-0.5 % AERO Apply 1 application topically as needed for irritation (perineal discomfort). 02/19/21   McVey, Murray Hodgkins, CNM  cephALEXin (KEFLEX) 500 MG capsule Take 1 capsule (500 mg total) by mouth 2 (two) times daily. 06/09/22   Delman Kitten, MD  coconut oil OIL Apply 1 application topically as needed. 02/19/21   McVey, Murray Hodgkins, CNM  ferrous sulfate 325 (65 FE) MG tablet Take 1 tablet (325 mg total) by mouth 2 (two) times daily with a meal. 02/19/21   McVey, Murray Hodgkins, CNM  hydrocortisone cream 1 % Apply topically 3 (three) times daily. 02/19/21   McVey, Murray Hodgkins, CNM  ibuprofen (ADVIL) 600 MG tablet Take 1 tablet (600 mg total) by mouth every 6 (six) hours. 02/19/21   McVey, Murray Hodgkins, CNM  labetalol (NORMODYNE) 200 MG tablet Take 1 tablet (200 mg total) by mouth 2 (two) times daily. 02/24/21   Ward, Delice Bison, DO  ondansetron (ZOFRAN ODT) 4 MG disintegrating tablet Take 1 tablet (4 mg total) by mouth every 8 (eight) hours as needed for nausea or vomiting. 02/13/18   Paulette Blanch, MD  ondansetron (ZOFRAN-ODT) 4  MG disintegrating tablet Take 1 tablet (4 mg total) by mouth every 6 (six) hours as needed for nausea or vomiting. 06/09/22   Delman Kitten, MD  senna-docusate (SENOKOT-S) 8.6-50 MG tablet Take 2 tablets by mouth daily. 02/20/21   McVey, Murray Hodgkins, CNM  simethicone (MYLICON) 80 MG chewable tablet Chew 1 tablet (80 mg total) by mouth as needed for flatulence. 02/19/21   McVey, Murray Hodgkins, CNM  witch hazel-glycerin (TUCKS) pad Apply 1 application topically as needed for hemorrhoids. 02/19/21   McVey, Murray Hodgkins, CNM    Family History History reviewed. No pertinent family history.  Social History Social History   Tobacco Use   Smoking status: Never   Smokeless tobacco: Never  Vaping Use   Vaping Use: Never used  Substance Use Topics   Alcohol use: No   Drug use: No     Allergies   Patient has no known allergies.   Review of Systems Review of Systems   Neurological:  Positive for dizziness.     Physical Exam Triage Vital Signs ED Triage Vitals [08/05/22 1242]  Enc Vitals Group     BP 120/83     Pulse Rate 70     Resp 18     Temp 98 F (36.7 C)     Temp src      SpO2 98 %     Weight      Height      Head Circumference      Peak Flow      Pain Score 0     Pain Loc      Pain Edu?      Excl. in [place]?    No data found.  Updated Vital Signs BP 120/83   Pulse 70   Temp 98 F (36.7 C)   Resp 18   LMP 07/19/2022 (Exact Date)   SpO2 98%   Breastfeeding No   Visual Acuity Right Eye Distance:   Left Eye Distance:   Bilateral Distance:    Right Eye Near:   Left Eye Near:    Bilateral Near:     Physical Exam Vitals reviewed.  Constitutional:      Appearance: Normal appearance.  HENT:     Right Ear: A middle ear effusion is present. Tympanic membrane is bulging.     Left Ear: A middle ear effusion is present. Tympanic membrane is bulging.  Skin:    General: Skin is warm and dry.  Neurological:     General: No focal deficit present.     Mental Status: She is alert and oriented to person, place, and time.  Psychiatric:        Mood and Affect: Mood normal.        Behavior: Behavior normal.      UC Treatments / Results  Labs (all labs ordered are listed, but only abnormal results are displayed) Labs Reviewed - No data to display  EKG   Radiology No results found.  Procedures Procedures (including critical care time)  Medications Ordered in UC Medications - No data to display  Initial Impression / Assessment and Plan / UC Course  I have reviewed the triage vital signs and the nursing notes.  Pertinent labs & imaging results that were available during my care of the patient were reviewed by me and considered in my medical decision making (see chart for details).   TMs are bulging bilaterally with effusion present.  Suspect sinusitis which is causing increased middle ear pressure resulting  in vertigo  symptoms.  Will prescribe a course of prednisone to relieve sinus inflammation and allow eustachian tubes to function properly.  Also will give meclizine for symptoms of nausea and dizziness.   Final Clinical Impressions(s) / UC Diagnoses   Final diagnoses:  None   Discharge Instructions   None    ED Prescriptions   None    PDMP not reviewed this encounter.   Rose Phi, FNP 08/05/22 1300

## 2022-08-09 ENCOUNTER — Emergency Department: Payer: BC Managed Care – PPO

## 2022-08-09 ENCOUNTER — Emergency Department
Admission: EM | Admit: 2022-08-09 | Discharge: 2022-08-09 | Disposition: A | Discharge status: Home / Self Care | Payer: BC Managed Care – PPO | Attending: Emergency Medicine | Admitting: Emergency Medicine

## 2022-08-09 ENCOUNTER — Other Ambulatory Visit: Payer: Self-pay

## 2022-08-09 DIAGNOSIS — H8110 Benign paroxysmal vertigo, unspecified ear: Secondary | ICD-10-CM | POA: Diagnosis not present

## 2022-08-09 DIAGNOSIS — R42 Dizziness and giddiness: Secondary | ICD-10-CM | POA: Diagnosis present

## 2022-08-09 LAB — CBC WITH DIFFERENTIAL/PLATELET
Abs Immature Granulocytes: 0.05 10*3/uL (ref 0.00–0.07)
Basophils Absolute: 0.1 10*3/uL (ref 0.0–0.1)
Basophils Relative: 0 %
Eosinophils Absolute: 0 10*3/uL (ref 0.0–0.5)
Eosinophils Relative: 0 %
HCT: 41.6 % (ref 36.0–46.0)
Hemoglobin: 13.6 g/dL (ref 12.0–15.0)
Immature Granulocytes: 0 %
Lymphocytes Relative: 33 %
Lymphs Abs: 4.5 10*3/uL — ABNORMAL HIGH (ref 0.7–4.0)
MCH: 28.2 pg (ref 26.0–34.0)
MCHC: 32.7 g/dL (ref 30.0–36.0)
MCV: 86.3 fL (ref 80.0–100.0)
Monocytes Absolute: 1.3 10*3/uL — ABNORMAL HIGH (ref 0.1–1.0)
Monocytes Relative: 10 %
Neutro Abs: 7.6 10*3/uL (ref 1.7–7.7)
Neutrophils Relative %: 57 %
Platelets: 314 10*3/uL (ref 150–400)
RBC: 4.82 MIL/uL (ref 3.87–5.11)
RDW: 11.8 % (ref 11.5–15.5)
WBC: 13.5 10*3/uL — ABNORMAL HIGH (ref 4.0–10.5)
nRBC: 0 % (ref 0.0–0.2)

## 2022-08-09 LAB — URINALYSIS, ROUTINE W REFLEX MICROSCOPIC
Bilirubin Urine: NEGATIVE
Glucose, UA: NEGATIVE mg/dL
Hgb urine dipstick: NEGATIVE
Ketones, ur: NEGATIVE mg/dL
Nitrite: NEGATIVE
Protein, ur: NEGATIVE mg/dL
Specific Gravity, Urine: 1.02 (ref 1.005–1.030)
pH: 8 (ref 5.0–8.0)

## 2022-08-09 LAB — BASIC METABOLIC PANEL
Anion gap: 6 (ref 5–15)
BUN: 10 mg/dL (ref 6–20)
CO2: 26 mmol/L (ref 22–32)
Calcium: 9.1 mg/dL (ref 8.9–10.3)
Chloride: 108 mmol/L (ref 98–111)
Creatinine, Ser: 0.7 mg/dL (ref 0.44–1.00)
GFR, Estimated: >60 mL/min (ref >60)
Glucose, Bld: 102 mg/dL — ABNORMAL HIGH (ref 70–99)
Potassium: 3.4 mmol/L — ABNORMAL LOW (ref 3.5–5.1)
Sodium: 140 mmol/L (ref 135–145)

## 2022-08-09 LAB — POC URINE PREG, ED: Preg Test, Ur: NEGATIVE

## 2022-08-09 LAB — TROPONIN I (HIGH SENSITIVITY)
Troponin I (High Sensitivity): 3 ng/L (ref <18)
Troponin I (High Sensitivity): 3 ng/L (ref <18)

## 2022-08-09 MED ORDER — ONDANSETRON 4 MG PO TBDP: 4.0000 mg | ORAL_TABLET | Freq: Three times a day (TID) | ORAL | 0 refills | Status: DC | PRN

## 2022-08-09 MED ORDER — ONDANSETRON 4 MG PO TBDP
4.0000 mg | ORAL_TABLET | Freq: Once | ORAL | Status: AC
  Administered 2022-08-09: 4 mg via ORAL
  Filled 2022-08-09: qty 1

## 2022-08-09 NOTE — ED Triage Notes (Signed)
Pt presents to ED with c/o of having dizziness for the past week. Pt states she had fainted around Thanksgiving. Pt also does endorse nausea when dizziness happens. Pt is A&Ox4. Pt ambulatory to to triage with steady gait.

## 2022-08-09 NOTE — ED Provider Notes (Signed)
[facility] Provider Note    Event Date/Time   First MD Initiated Contact with Patient 08/09/22 2046     (approximate)  History   Chief Complaint: Dizziness  HPI  Diane Morse is a 26 y.o. female with no significant past medical history who presents to the emergency department for dizziness.  According to the patient since Thanksgiving approximately 1 week patient has been experiencing intermittent episodes of dizziness.  She states the dizziness is the room spinning around her which makes her very nauseated.  Patient states it only seems to occur when she turns her head especially if she turns her head quickly.  Patient was seen at an urgent care was prescribed meclizine but states continues to have intermittent dizziness which came to the emergency department.  Patient denies any fever.  Denies any headache denies any weakness or numbness of any focal arm or leg.   Physical Exa   Triage Vital Signs: ED Triage Vitals [08/09/22 1712]  Enc Vitals Group     BP (!) 137/97     Pulse Rate 88     Resp 17     Temp 98.1 F (36.7 C)     Temp Source Oral     SpO2 100 %     Weight      Height      Head Circumference      Peak Flow      Pain Score 0     Pain Loc      Pain Edu?      Excl. in [place]?     Most recent vital signs: Vitals:   08/09/22 1712 08/09/22 2212  BP: (!) 137/97 109/80  Pulse: 88 70  Resp: 17 15  Temp: 98.1 F (36.7 C) 98.4 F (36.9 C)  SpO2: 100% 98%    General: Awake, no distress.  Normal tympanic membrane's. CV:  Good peripheral perfusion.  Regular rate and rhythm  Resp:  Normal effort.  Equal breath sounds bilaterally.  Abd:  No distention.  Soft, nontender.  No rebound or guarding.  ED Results / Procedures / Treatments   EKG  EKG viewed and interpreted by myself shows normal sinus rhythm 81 bpm with a narrow QRS, normal axis, normal intervals, no concerning ST changes.  RADIOLOGY  I have reviewed and interpreted the CT  head images.  No bleed or large abnormality seen on my evaluation. Radiology has read the CT scan is negative.   MEDICATIONS ORDERED IN ED: Medications  ondansetron (ZOFRAN-ODT) disintegrating tablet 4 mg (4 mg Oral Given 08/09/22 2152)     IMPRESSION / MDM / ASSESSMENT AND PLAN / ED COURSE  I reviewed the triage vital signs and the nursing notes.  Patient's presentation is most consistent with acute presentation with potential threat to life or bodily function.  Patient presents emergency department for dizziness.  States that dizziness is intermittent described as spinning sensation that typically occurs with changes in head movement.  Overall the patient appears well.  Symptoms are very suggestive of BPPV.  Patient has a prescription for meclizine which she states she has been using and has been helping but she continues to have intermittent symptoms.  Given no history of vertigo in the past and continued symptoms I performed a CT scan of the head which shows no acute finding.  Patient's medical work-up is reassuring including a CBC which is slight leukocytosis otherwise normal, normal chemistry, negative troponin and a negative pregnancy test.  Urinalysis  shows no concerning abnormality.  Given the patient's reassuring physical exam, reassuring work-up I believe the patient is safe for discharge home with PCP follow-up.  Symptoms are very suggestive of BPPV and I discussed with the patient to continue using her meclizine as needed.  I will prescribe Zofran for nausea if needed.  I have referred to ENT if the patient continues to have symptoms.  FINAL CLINICAL IMPRESSION(S) / ED DIAGNOSES   BPPV  Rx / DC Orders   Zofran ENT referral  Note:  This document was prepared using Dragon voice recognition software and may include unintentional dictation errors.   Minna Antis, MD 08/09/22 2220

## 2022-08-09 NOTE — ED Notes (Signed)
E signature pad not working. Pt educated on discharge instructions and verbalized understanding.  

## 2022-08-09 NOTE — ED Provider Triage Note (Signed)
Emergency Medicine Provider Triage Evaluation Note  Diane Morse , a 26 y.o. female  was evaluated in triage.  Pt complains of dizzy since Thanksgiving. Reports she feels like she is going to pass out. Reports intermittent headaches. Reports "it doesn't feel like I'm here." Feels like her heart is racing. H/o anxiety.  Review of Systems  Positive: Palpitations, dizzy, headaches Negative: cp  Physical Exam  LMP 07/19/2022 (Exact Date)  Gen:   Awake, no distress   Resp:  Normal effort  MSK:   Moves extremities without difficulty  Other:    Medical Decision Making  Medically screening exam initiated at 5:10 PM.  Appropriate orders placed.  Diane Morse was informed that the remainder of the evaluation will be completed by another provider, this initial triage assessment does not replace that evaluation, and the importance of remaining in the ED until their evaluation is complete.     Jackelyn Hoehn, PA-C 08/09/22 1712

## 2023-03-09 ENCOUNTER — Other Ambulatory Visit: Payer: Self-pay

## 2023-03-09 ENCOUNTER — Emergency Department
Admission: EM | Admit: 2023-03-09 | Discharge: 2023-03-09 | Disposition: A | Discharge status: Home / Self Care | Payer: BC Managed Care – PPO | Attending: Emergency Medicine | Admitting: Emergency Medicine

## 2023-03-09 ENCOUNTER — Encounter: Payer: Self-pay | Admitting: Emergency Medicine

## 2023-03-09 ENCOUNTER — Emergency Department: Payer: BC Managed Care – PPO

## 2023-03-09 DIAGNOSIS — R1011 Right upper quadrant pain: Secondary | ICD-10-CM | POA: Diagnosis present

## 2023-03-09 DIAGNOSIS — R1013 Epigastric pain: Secondary | ICD-10-CM | POA: Diagnosis not present

## 2023-03-09 DIAGNOSIS — R110 Nausea: Secondary | ICD-10-CM | POA: Diagnosis not present

## 2023-03-09 LAB — CBC WITH DIFFERENTIAL/PLATELET
Abs Immature Granulocytes: 0.01 10*3/uL (ref 0.00–0.07)
Basophils Absolute: 0.1 10*3/uL (ref 0.0–0.1)
Basophils Relative: 1 %
Eosinophils Absolute: 0.2 10*3/uL (ref 0.0–0.5)
Eosinophils Relative: 3 %
HCT: 39.1 % (ref 36.0–46.0)
Hemoglobin: 12.9 g/dL (ref 12.0–15.0)
Immature Granulocytes: 0 %
Lymphocytes Relative: 47 %
Lymphs Abs: 3.8 10*3/uL (ref 0.7–4.0)
MCH: 28.5 pg (ref 26.0–34.0)
MCHC: 33 g/dL (ref 30.0–36.0)
MCV: 86.3 fL (ref 80.0–100.0)
Monocytes Absolute: 0.6 10*3/uL (ref 0.1–1.0)
Monocytes Relative: 7 %
Neutro Abs: 3.4 10*3/uL (ref 1.7–7.7)
Neutrophils Relative %: 42 %
Platelets: 252 10*3/uL (ref 150–400)
RBC: 4.53 MIL/uL (ref 3.87–5.11)
RDW: 11.6 % (ref 11.5–15.5)
WBC: 8.1 10*3/uL (ref 4.0–10.5)
nRBC: 0 % (ref 0.0–0.2)

## 2023-03-09 LAB — COMPREHENSIVE METABOLIC PANEL
ALT: 24 U/L (ref 0–44)
AST: 25 U/L (ref 15–41)
Albumin: 4.4 g/dL (ref 3.5–5.0)
Alkaline Phosphatase: 64 U/L (ref 38–126)
Anion gap: 10 (ref 5–15)
BUN: 12 mg/dL (ref 6–20)
CO2: 25 mmol/L (ref 22–32)
Calcium: 9.7 mg/dL (ref 8.9–10.3)
Chloride: 103 mmol/L (ref 98–111)
Creatinine, Ser: 0.67 mg/dL (ref 0.44–1.00)
GFR, Estimated: >60 mL/min (ref >60)
Glucose, Bld: 104 mg/dL — ABNORMAL HIGH (ref 70–99)
Potassium: 3.8 mmol/L (ref 3.5–5.1)
Sodium: 138 mmol/L (ref 135–145)
Total Bilirubin: 0.6 mg/dL (ref 0.3–1.2)
Total Protein: 7.6 g/dL (ref 6.5–8.1)

## 2023-03-09 LAB — LIPASE, BLOOD: Lipase: 42 U/L (ref 11–51)

## 2023-03-09 MED ORDER — KETOROLAC TROMETHAMINE 30 MG/ML IJ SOLN
30.0000 mg | Freq: Once | INTRAMUSCULAR | Status: AC
  Administered 2023-03-09: 30 mg via INTRAMUSCULAR
  Filled 2023-03-09: qty 1

## 2023-03-09 MED ORDER — OXYCODONE HCL 5 MG PO TABS
5.0000 mg | ORAL_TABLET | Freq: Once | ORAL | Status: AC
  Administered 2023-03-09: 5 mg via ORAL
  Filled 2023-03-09: qty 1

## 2023-03-09 MED ORDER — DICYCLOMINE HCL 10 MG PO CAPS: 10.0000 mg | ORAL_CAPSULE | Freq: Three times a day (TID) | ORAL | 0 refills | Status: DC | PRN

## 2023-03-09 MED ORDER — OXYCODONE HCL 5 MG PO TABS: 5.0000 mg | ORAL_TABLET | Freq: Three times a day (TID) | ORAL | 0 refills | Status: DC | PRN

## 2023-03-09 MED ORDER — ONDANSETRON 4 MG PO TBDP: 4.0000 mg | ORAL_TABLET | Freq: Three times a day (TID) | ORAL | 0 refills | Status: DC | PRN

## 2023-03-09 MED ORDER — ONDANSETRON 4 MG PO TBDP
4.0000 mg | ORAL_TABLET | Freq: Once | ORAL | Status: AC
  Administered 2023-03-09: 4 mg via ORAL
  Filled 2023-03-09: qty 1

## 2023-03-09 MED ORDER — DICYCLOMINE HCL 10 MG PO CAPS
20.0000 mg | ORAL_CAPSULE | Freq: Once | ORAL | Status: AC
  Administered 2023-03-09: 20 mg via ORAL
  Filled 2023-03-09: qty 2

## 2023-03-09 NOTE — Discharge Instructions (Addendum)
Please take Tylenol and ibuprofen/Advil for your pain.  It is safe to take them together, or to alternate them every few hours.  Take up to 1000mg  of Tylenol at a time, up to 4 times per day.  Do not take more than 4000 mg of Tylenol in 24 hours.  For ibuprofen, take 400-600 mg, 3 - 4 times per day.  Use oxycodone as needed for more severe/breakthrough pain.  Use Zofran as needed for nausea and vomiting  You can reach out to the surgeon to see them in the clinic to discuss your gallbladder polyp and tenderness.  Return to the ED with any worsening symptoms despite these measures

## 2023-03-09 NOTE — ED Provider Notes (Signed)
-----------------------------------------   8:25 AM on 03/09/2023 ----------------------------------------- Patient care assumed from Dr. Katrinka Blazing.  Patient's lab work is reassuring including a normal CBC with a normal white blood cell count, reassuring chemistry with normal LFTs, normal lipase.  Ultrasound shows no significant findings to suggest acute cholecystitis there is a 7 mm gallbladder polyp.  Patient states her pain is much improved after pain medication.  Overall she appears well.  Discussed with the patient we will have her follow-up with surgery as an outpatient discussed return precautions for any worsening pain fever.  Patient agreeable to plan of care and discharge.   Minna Antis, MD 03/09/23 [phone]

## 2023-03-09 NOTE — ED Provider Notes (Signed)
[facility] Provider Note    Event Date/Time   First MD Initiated Contact with Patient 03/09/23 0403     (approximate)   History   Abdominal Pain   HPI  Diane Morse is a 27 y.o. female who presents to the ED for evaluation of Abdominal Pain   I reviewed clinic visit from 6/24, ED visit from 06/11/22 and 06/09/22 where she was evaluated for the same complaints.  Empirically started on H. pylori antibiotics on 6/24.  Abdominal CT and ultrasound performed in October.  She was referred to GI during this clinic visit last week.  Started on Protonix and Phenergan.  Patient reports that she has had chronic, nearly 1 year, of epigastric pain and nausea, but for the past 4 to 5 days, since Monday, she has had worsening RUQ pain alongside her chronic nausea.   Physical Exam   Triage Vital Signs: ED Triage Vitals [03/09/23 0335]  Enc Vitals Group     BP 124/77     Pulse Rate 82     Resp 18     Temp 97.8 F (36.6 C)     Temp Source Oral     SpO2 100 %     Weight 139 lb (63 kg)     Height 5\' 2"  (1.575 m)     Head Circumference      Peak Flow      Pain Score 7     Pain Loc      Pain Edu?      Excl. in GC?     Most recent vital signs: Vitals:   03/09/23 0335  BP: 124/77  Pulse: 82  Resp: 18  Temp: 97.8 F (36.6 C)  SpO2: 100%    General: Awake, no distress.  CV:  Good peripheral perfusion.  Resp:  Normal effort.  Abd:  No distention.  Mild RUQ tenderness without peritoneal features.  Remainder of the abdomen is benign. MSK:  No deformity noted.  Neuro:  No focal deficits appreciated. Other:     ED Results / Procedures / Treatments   Labs (all labs ordered are listed, but only abnormal results are displayed) Labs Reviewed  COMPREHENSIVE METABOLIC PANEL - Abnormal; Notable for the following components:      Result Value   Glucose, Bld 104 (*)    All other components within normal limits  CBC WITH DIFFERENTIAL/PLATELET  LIPASE,  BLOOD  URINALYSIS, ROUTINE W REFLEX MICROSCOPIC  POC URINE PREG, ED    EKG   RADIOLOGY RUQ ultrasound interpreted by me with a polyp without stones or pericholecystic fluid or wall thickening  Official radiology report(s): US ABDOMEN LIMITED RUQ (LIVER/GB)  Result Date: 03/09/2023 CLINICAL DATA:  27 year old female with history of right upper quadrant abdominal pain. EXAM: ULTRASOUND ABDOMEN LIMITED RIGHT UPPER QUADRANT COMPARISON:  Abdominal ultrasound 06/09/2022. FINDINGS: Gallbladder: Echogenic mobile nonshadowing structure associated with the wall of the gallbladder measuring up to 7 mm, compatible with a gallbladder polyp. No definite stones or sludge. Gallbladder is only mildly distended. Gallbladder wall thickness is normal at 2 mm. No pericholecystic fluid. Per report from the sonographer, there was a positive sonographic Murphy's sign on examination. Common bile duct: Diameter: 3.8 mm Liver: No focal lesion identified. Within normal limits in parenchymal echogenicity. Portal vein is patent on color Doppler imaging with normal direction of blood flow towards the liver. Other: None. IMPRESSION: 1. Positive sonographic Murphy's sign without other objective findings to suggest an acute cholecystitis. Acute cholecystitis  is not excluded, but not strongly favored on the basis of this examination. Surgical consultation is recommended for further clinical evaluation. 2. 7 mm gallbladder polyp. This may represent a benign polyp, although an adenoma or small cancer is difficult to entirely exclude. Follow-up yearly ultrasound is recommended. This recommendation follows ACR consensus guidelines: White Paper of the ACR Incidental Findings Committee II on Gallbladder and Biliary Findings. J Am Coll Radiol 2013:;10:953-956. Electronically Signed   By: Trudie Reed M.D.   On: 03/09/2023 06:33    PROCEDURES and INTERVENTIONS:  Procedures  Medications  ketorolac (TORADOL) 30 MG/ML injection 30 mg  (has no administration in time range)  oxyCODONE (Oxy IR/ROXICODONE) immediate release tablet 5 mg (has no administration in time range)  dicyclomine (BENTYL) capsule 20 mg (20 mg Oral Given 03/09/23 0517)  ondansetron (ZOFRAN-ODT) disintegrating tablet 4 mg (4 mg Oral Given 03/09/23 0517)     IMPRESSION / MDM / ASSESSMENT AND PLAN / ED COURSE  I reviewed the triage vital signs and the nursing notes.  Differential diagnosis includes, but is not limited to, IBS, biliary colic, gastritis  {Patient presents with symptoms of an acute illness or injury that is potentially life-threatening.  Patient presents with acute on chronic pain.  Look systemically well and has reassuring vital signs and blood work.  Normal CBC and CMP and lipase.  No LFT derangements.  Has some mild localized RUQ tenderness.  Ultrasound with a known chronic polyp but no stones or concerning features, but a reported positive sonographic Murphy sign questions cholecystitis.  This clinically unlikely.  We will reassess after analgesia and if her symptoms are controlled she would be suitable for outpatient surgical follow-up.      FINAL CLINICAL IMPRESSION(S) / ED DIAGNOSES   Final diagnoses:  Right upper quadrant abdominal pain     Rx / DC Orders   ED Discharge Orders          Ordered    ondansetron (ZOFRAN-ODT) 4 MG disintegrating tablet  Every 8 hours PRN        03/09/23 0710    oxyCODONE (ROXICODONE) 5 MG immediate release tablet  Every 8 hours PRN        03/09/23 0710             Note:  This document was prepared using Dragon voice recognition software and may include unintentional dictation errors.   Delton Prairie, MD 03/09/23 [phone]

## 2023-03-09 NOTE — ED Triage Notes (Signed)
Patient endorses upper abdominal pain radiating into back since August of last year, worse tonight.

## 2023-03-25 ENCOUNTER — Encounter: Payer: Self-pay | Admitting: Surgery

## 2023-03-25 ENCOUNTER — Ambulatory Visit (INDEPENDENT_AMBULATORY_CARE_PROVIDER_SITE_OTHER): Payer: BC Managed Care – PPO | Admitting: Surgery

## 2023-03-25 DIAGNOSIS — K8020 Calculus of gallbladder without cholecystitis without obstruction: Secondary | ICD-10-CM | POA: Diagnosis not present

## 2023-03-25 NOTE — Patient Instructions (Addendum)
If you have any concerns or questions, please feel free to call our office. Call our office after your appointment with GI.   Gallbladder Eating Plan High blood cholesterol, obesity, a sedentary lifestyle, an unhealthy diet, and diabetes are risk factors for developing gallstones. If you have a gallbladder condition, you may have trouble digesting fats and tolerating high fat intake. Eating a low-fat diet can help reduce your symptoms and may be helpful before and after having surgery to remove your gallbladder (cholecystectomy). Your health care provider may recommend that you work with a dietitian to help you reduce the amount of fat in your diet. What are tips for following this plan? General guidelines Limit your fat intake to less than 30% of your total daily calories. If you eat around 1,800 calories each day, this means eating less than 60 grams (g) of fat per day. Fat is an important part of a healthy diet. Eating a low-fat diet can make it hard to maintain a healthy body weight. Ask your dietitian how much fat, calories, and other nutrients you need each day. Eat small, frequent meals throughout the day instead of three large meals. Drink at least 8-10 cups (1.9-2.4 L) of fluid a day. Drink enough fluid to keep your urine pale yellow. If you drink alcohol: Limit how much you have to: 0-1 drink a day for women who are not pregnant. 0-2 drinks a day for men. Know how much alcohol is in a drink. In the [place] one drink equals one 12 oz bottle of beer (355 mL), one 5 oz glass of wine (148 mL), or one 1 oz glass of hard liquor (44 mL). Reading food labels  Check nutrition facts on food labels for the amount of fat per serving. Choose foods with less than 3 grams of fat per serving. Shopping Choose nonfat and low-fat healthy foods. Look for the words "nonfat," "low-fat," or "fat-free." Avoid buying processed or prepackaged foods. Cooking Cook using low-fat methods, such as baking, broiling,  grilling, or boiling. Cook with small amounts of healthy fats, such as olive oil, grapeseed oil, canola oil, avocado oil, or sunflower oil. What foods are recommended?  All fresh, frozen, or canned fruits and vegetables. Whole grains. Low-fat or nonfat (skim) milk and yogurt. Lean meat, skinless poultry, fish, eggs, and beans. Low-fat protein supplement powders or drinks. Spices and herbs. The items listed above may not be a complete list of foods and beverages you can eat and drink. Contact a dietitian for more information. What foods are not recommended? High-fat foods. These include baked goods, fast food, fatty cuts of meat, ice cream, french toast, sweet rolls, pizza, cheese bread, foods covered with butter, creamy sauces, or cheese. Fried foods. These include french fries, tempura, battered fish, breaded chicken, fried breads, and sweets. Foods that cause bloating and gas. The items listed above may not be a complete list of foods that you should avoid. Contact a dietitian for more information. Summary A low-fat diet can be helpful if you have a gallbladder condition, or before and after gallbladder surgery. Limit your fat intake to less than 30% of your total daily calories. This is about 60 g of fat if you eat 1,800 calories each day. Eat small, frequent meals throughout the day instead of three large meals. This information is not intended to replace advice given to you by your health care provider. Make sure you discuss any questions you have with your health care provider.  Irritable Bowel Syndrome, Adult  Irritable bowel syndrome (IBS) is a group of symptoms that affects the organs responsible for digestion (gastrointestinal tract, or GI tract). IBS is not one specific disease. To regulate how the GI tract works, the body sends signals back and forth between the intestines and the brain. If you have IBS, there may be a problem with these signals. As a result, the GI tract does  not function normally. The intestines may become more sensitive and overreact to certain things. This may be especially true when you eat certain foods or when you are under stress. There are four main types of IBS. These may be determined based on the consistency of your stool (feces): IBS with mostly (predominance of) diarrhea. IBS with predominance of constipation. IBS with mixed bowel habits. This includes both diarrhea and constipation. IBS unclassified. This includes IBS that cannot be categorized into one of the other three main types. It is important to know which type of IBS you have. Certain treatments are more likely to be helpful for certain types of IBS. What are the causes? The exact cause of IBS is not known. What increases the risk? You may have a higher risk for IBS if you: Are female. Are younger than 40 years. Have a family history of IBS. Have a mental health condition, such as depression, anxiety, or post-traumatic stress disorder. Have had a bacterial infection of your GI tract. What are the signs or symptoms? Symptoms of IBS vary from person to person. The main symptom is abdominal pain or discomfort. Other symptoms usually include one or more of the following: Diarrhea, constipation, or both. Swelling or bloating in the abdomen. Feeling full after eating a small or regular-sized meal. Frequent gas. Mucus in the stool. A feeling of having more stool left after a bowel movement. Symptoms tend to come and go. They may be triggered by stress, mental health conditions, or certain foods. How is this diagnosed? This condition may be diagnosed based on a physical exam, your medical history, and your symptoms. You may have tests, such as: Blood tests. Stool test. Colonoscopy. This is a procedure in which your GI tract is viewed with a long, thin, flexible tube. How is this treated? There is no cure for IBS, but treatment can help relieve symptoms. Treatment depends on  the type of IBS you have, and may include: Changes to your diet, such as: Avoiding foods that cause symptoms. Drinking more water. Following a low-FODMAP (fermentable oligosaccharides, disaccharides, monosaccharides, and polyols) diet for up to 6 weeks, or as told by your health care provider. FODMAPs are sugars that are hard for some people to digest. Eating more fiber. Eating small meals at the same times every day. Medicines. These may include: Fiber supplements, if you have constipation. Medicine to control diarrhea (antidiarrheal medicines). Medicine to help control muscle tightening (spasms) in your GI tract (antispasmodic medicines). Medicines to help with mental health conditions, such as antidepressants. Talk therapy or counseling. Working with a dietitian to help create a food plan that is right for you. Managing your stress. Follow these instructions at home: Eating and drinking  Eat a healthy diet. Eat 5-6 small meals a day. Try to eat meals at about the same times each day. Do not eat large meals. Gradually eat more fiber-rich foods. These include whole grains, fruits, and vegetables. This may be especially helpful if you have IBS with constipation. Eat a diet low in FODMAPs. You may need to avoid foods such as citrus fruits, cabbage, garlic,  and onions. Drink enough fluid to keep your urine pale yellow. Keep a journal of foods that seem to trigger symptoms. Avoid foods and drinks that: Contain added sugar. Make your symptoms worse. These may include dairy products, caffeinated drinks, and carbonated drinks. Alcohol use Do not drink alcohol if: Your health care provider tells you not to drink. You are pregnant, may be pregnant, or are planning to become pregnant. If you drink alcohol: Limit how much you have to: 0-1 drink a day for women. 0-2 drinks a day for men. Know how much alcohol is in your drink. In the [place] one drink equals one 12 oz bottle of beer (355 mL),  one 5 oz glass of wine (148 mL), or one 1 oz glass of hard liquor (44 mL) General instructions Take over-the-counter and prescription medicines only as told by your health care provider. This includes supplements. Get enough exercise. Do at least 150 minutes of moderate-intensity exercise each week. Manage your stress. Getting enough sleep and exercise can help you manage stress. Keep all follow-up visits. This is important. This includes all visits with your health care provider and therapist. Where to find more information International Foundation for Functional Gastrointestinal Disorders: aboutibs.[employer] of Diabetes and Digestive and Kidney Diseases: [employer]Sync.si Contact a health care provider if: You have constant pain. You lose weight. You have diarrhea that gets worse. You have bleeding from the rectum. You vomit often. You have a fever. Get help right away if: You have severe abdominal pain. You have diarrhea with symptoms of dehydration, such as dizziness or dry mouth. You have bloody or black stools. You have severe abdominal bloating. You have vomiting that does not stop. You have blood in your vomit. Summary Irritable bowel syndrome (IBS) is not one specific disease. It is a group of symptoms that affects digestion. Your intestines may become more sensitive and overreact to certain things. This may be especially true when you eat certain foods or when you are under stress. There is no cure for IBS, but treatment can help relieve symptoms. This information is not intended to replace advice given to you by your health care provider. Make sure you discuss any questions you have with your health care provider. Document Revised: 08/09/2021 Document Reviewed: 08/09/2021 Elsevier Patient Education  2024 [employer].

## 2023-03-26 ENCOUNTER — Encounter: Payer: Self-pay | Admitting: Surgery

## 2023-03-26 NOTE — Progress Notes (Signed)
Patient ID: Diane Morse, female   DOB: October 31, 1995, 27 y.o.   MRN: [id number]  HPI Diane Morse is a 27 y.o. female seen in consultation at the request of Dr. Lenard Lance.  Patient endorses abdominal pain for the last 8 months or so.  Pain is intermittent mid abdomen colicky type and mild to moderate.  She also endorses intermittent nausea and the pain is radiated to the right upper quadrant. He has had prior workup to include a CT scan as well as ultrasound.  Please note that I have personally reviewed the images.  There is only of significance of 7 mm polyp without evidence of cholecystitis.  Normal common bile duct.  No other acute intra-abdominal pathology. Does reports intermittent episodes of constipation and diarrhea.  No fevers no chills no jaundice no biliary obstruction.  She has pending GI evaluation She has not had formal endoscopic evaluation She does recall having family members with biliary disease.  Recent lab work revealed normal CBC CMP and lipase She is able to perform more than 4 METS of activity without any shortness of breath or chest pain.  HPI  Past Medical History:  Diagnosis Date   Medical history non-contributory     Past Surgical History:  Procedure Laterality Date   NO PAST SURGERIES      History reviewed. No pertinent family history.  Social History Social History   Tobacco Use   Smoking status: Never   Smokeless tobacco: Never  Vaping Use   Vaping status: Never Used  Substance Use Topics   Alcohol use: No   Drug use: No    No Known Allergies  Current Outpatient Medications  Medication Sig Dispense Refill   acetaminophen (TYLENOL) 325 MG tablet Take 2 tablets (650 mg total) by mouth every 4 (four) hours as needed (for pain scale < 4). 30 tablet 0   aspirin EC 81 MG tablet Take by mouth.     benzocaine-Menthol (DERMOPLAST) 20-0.5 % AERO Apply 1 application topically as needed for irritation (perineal discomfort).     Blood Pressure  Monitoring (SELF-TAKING BLOOD PRESSURE) KIT To monitor blood pressure at home during pregnancy.     cephALEXin (KEFLEX) 500 MG capsule Take 1 capsule (500 mg total) by mouth 2 (two) times daily. 14 capsule 0   coconut oil OIL Apply 1 application topically as needed.  0   dicyclomine (BENTYL) 10 MG capsule Take 1 capsule (10 mg total) by mouth 3 (three) times daily as needed for spasms. 30 capsule 0   Doxylamine-Pyridoxine 10-10 MG TBEC 2 Tablets ay night.  Add 1 tablet on day 3 in the morning if symptoms continue.  If still symptomatic on day 4 add 1 tablet in the afternoon.     ferrous sulfate 325 (65 FE) MG tablet Take 1 tablet (325 mg total) by mouth 2 (two) times daily with a meal. 60 tablet 0   hydrocortisone cream 1 % Apply topically 3 (three) times daily. 30 g 0   ibuprofen (ADVIL) 600 MG tablet Take 1 tablet (600 mg total) by mouth every 6 (six) hours. 30 tablet 0   labetalol (NORMODYNE) 200 MG tablet Take 1 tablet (200 mg total) by mouth 2 (two) times daily. 60 tablet 0   meclizine (ANTIVERT) 12.5 MG tablet Take 1 or 2 tablets three times daily for dizziness and nausea. 30 tablet 0   NIFEdipine (ADALAT CC) 30 MG 24 hr tablet Take 1 tablet by mouth daily.     ondansetron (ZOFRAN-ODT)  4 MG disintegrating tablet Take 1 tablet (4 mg total) by mouth every 8 (eight) hours as needed for nausea or vomiting. 20 tablet 0   ondansetron (ZOFRAN-ODT) 4 MG disintegrating tablet Take 1 tablet (4 mg total) by mouth every 8 (eight) hours as needed. 20 tablet 0   oxyCODONE (ROXICODONE) 5 MG immediate release tablet Take 1 tablet (5 mg total) by mouth every 8 (eight) hours as needed. 15 tablet 0   predniSONE (STERAPRED UNI-PAK 21 TAB) 10 MG (21) TBPK tablet Take by mouth daily. Take 6 tabs by mouth daily for 1 day, then 5 tabs for 1 day, then 4 tabs for 1 day, then 3 tabs for 1 day, then 2 tabs for 1 day, then 1 tab by mouth daily for 1 day 21 tablet 0   promethazine (PHENERGAN) 25 MG tablet Take by mouth.      senna-docusate (SENOKOT-S) 8.6-50 MG tablet Take 2 tablets by mouth daily. 60 tablet 0   simethicone (MYLICON) 80 MG chewable tablet Chew 1 tablet (80 mg total) by mouth as needed for flatulence. 30 tablet 0   witch hazel-glycerin (TUCKS) pad Apply 1 application topically as needed for hemorrhoids. 40 each 12   No current facility-administered medications for this visit.     Review of Systems Full ROS  was asked and was negative except for the information on the HPI  Physical Exam Blood pressure 119/81, pulse 76, temperature 98.2 F (36.8 C), temperature source Oral, height 5\' 2"  (1.575 m), weight 138 lb 9.6 oz (62.9 kg), last menstrual period 02/27/2023, SpO2 99%. CONSTITUTIONAL: NAD. EYES: Pupils are equal, round, Sclera are non-icteric. EARS, NOSE, MOUTH AND THROAT: The oral mucosa is pink and moist. Hearing is intact to voice. LYMPH NODES:  Lymph nodes in the neck are normal. RESPIRATORY:  Lungs are clear. There is normal respiratory effort, with equal breath sounds bilaterally, and without pathologic use of accessory muscles. CARDIOVASCULAR: Heart is regular without murmurs, gallops, or rubs. GI: The abdomen is  soft, mild tenderness on epigastrium, w/o peritonitis or Murphy, . There are no palpable masses. There is no hepatosplenomegaly. There are normal bowel sounds in all quadrants. GU: Rectal deferred.   MUSCULOSKELETAL: Normal muscle strength and tone. No cyanosis or edema.   SKIN: Turgor is good and there are no pathologic skin lesions or ulcers. NEUROLOGIC: Motor and sensation is grossly normal. Cranial nerves are grossly intact. PSYCH:  Oriented to person, place and time. Affect is normal.  Data Reviewed  I have personally reviewed the patient's imaging, laboratory findings and medical records.    Assessment/Plan   27 year old female with recurrent abdominal pain that is chronic associated with a gallbladder polyp.  Not a surgical veins that her pain is explained by the  gallbladder polyp.  There was no evidence of cholecystitis on imaging studies or clinical exam.  I had an extensive discussion with patient regarding her disease process.  I do think that it might be worth pursuing further workup with GI.  Some of her symptoms are certainly related to inflammatory bowel syndrome.  She were to have more of a functional GI issues cholecystectomy will not address her pain. Extensive conversation with the patient she is in agreement to proceed with further GI workup.  She also understands that if we may not be able to elucidate the source of the pain we may proceed with cholecystectomy.  Discussed with her in detail about what cholecystectomy will entail.  I will be happy to see her  back in a few months to revisit gallbladder polyp.  There is no need for immediate surgical intervention.  Please note that I spent 55 minutes in this encounter including personally reviewing medical records, personally reviewing imaging studies, coordinating her care, placing orders and performing documentation. A copy of this report was sent to the referring provider  Sterling Big, MD FACS General Surgeon 03/26/2023, 8:55 AM

## 2023-04-24 ENCOUNTER — Ambulatory Visit: Payer: BC Managed Care – PPO

## 2023-04-24 DIAGNOSIS — K2950 Unspecified chronic gastritis without bleeding: Secondary | ICD-10-CM | POA: Diagnosis not present

## 2023-12-18 ENCOUNTER — Ambulatory Visit (INDEPENDENT_AMBULATORY_CARE_PROVIDER_SITE_OTHER): Admitting: Family

## 2023-12-18 ENCOUNTER — Encounter: Payer: Self-pay | Admitting: Family

## 2023-12-18 VITALS — BP 120/73 | HR 61 | Ht 62.0 in | Wt 138.0 lb

## 2023-12-18 DIAGNOSIS — R5383 Other fatigue: Secondary | ICD-10-CM

## 2023-12-18 DIAGNOSIS — Z114 Encounter for screening for human immunodeficiency virus [HIV]: Secondary | ICD-10-CM

## 2023-12-18 DIAGNOSIS — I10 Essential (primary) hypertension: Secondary | ICD-10-CM | POA: Diagnosis not present

## 2023-12-18 DIAGNOSIS — R102 Pelvic and perineal pain: Secondary | ICD-10-CM

## 2023-12-18 DIAGNOSIS — R7303 Prediabetes: Secondary | ICD-10-CM

## 2023-12-18 DIAGNOSIS — Z1159 Encounter for screening for other viral diseases: Secondary | ICD-10-CM

## 2023-12-18 DIAGNOSIS — E538 Deficiency of other specified B group vitamins: Secondary | ICD-10-CM

## 2023-12-18 DIAGNOSIS — E559 Vitamin D deficiency, unspecified: Secondary | ICD-10-CM

## 2023-12-18 DIAGNOSIS — E782 Mixed hyperlipidemia: Secondary | ICD-10-CM

## 2023-12-18 DIAGNOSIS — R079 Chest pain, unspecified: Secondary | ICD-10-CM

## 2023-12-18 LAB — POCT URINALYSIS DIPSTICK
Blood, UA: NEGATIVE
Glucose, UA: NEGATIVE
Ketones, UA: NEGATIVE
Leukocytes, UA: NEGATIVE
Nitrite, UA: NEGATIVE
Protein, UA: NEGATIVE
Spec Grav, UA: 1.025 (ref 1.010–1.025)
Urobilinogen, UA: 0.2 U/dL
pH, UA: 6 (ref 5.0–8.0)

## 2023-12-18 MED ORDER — ESCITALOPRAM OXALATE 5 MG PO TABS: 5.0000 mg | ORAL_TABLET | Freq: Every day | ORAL | 1 refills | Status: DC

## 2023-12-19 LAB — CMP14+EGFR
ALT: 26 IU/L (ref 0–32)
AST: 24 IU/L (ref 0–40)
Albumin: 4.6 g/dL (ref 4.0–5.0)
Alkaline Phosphatase: 77 IU/L (ref 44–121)
BUN/Creatinine Ratio: 14 (ref 9–23)
BUN: 11 mg/dL (ref 6–20)
Bilirubin Total: 0.4 mg/dL (ref 0.0–1.2)
CO2: 22 mmol/L (ref 20–29)
Calcium: 9.4 mg/dL (ref 8.7–10.2)
Chloride: 104 mmol/L (ref 96–106)
Creatinine, Ser: 0.77 mg/dL (ref 0.57–1.00)
Globulin, Total: 2.2 g/dL (ref 1.5–4.5)
Glucose: 86 mg/dL (ref 70–99)
Potassium: 4 mmol/L (ref 3.5–5.2)
Sodium: 139 mmol/L (ref 134–144)
Total Protein: 6.8 g/dL (ref 6.0–8.5)
eGFR: 107 mL/min/{1.73_m2} (ref >59)

## 2023-12-19 LAB — VITAMIN D 25 HYDROXY (VIT D DEFICIENCY, FRACTURES): Vit D, 25-Hydroxy: 17.2 ng/mL — ABNORMAL LOW (ref 30.0–100.0)

## 2023-12-19 LAB — CBC WITH DIFFERENTIAL/PLATELET
Basophils Absolute: 0.1 10*3/uL (ref 0.0–0.2)
Basos: 1 %
EOS (ABSOLUTE): 0.2 10*3/uL (ref 0.0–0.4)
Eos: 3 %
Hematocrit: 38.8 % (ref 34.0–46.6)
Hemoglobin: 12.3 g/dL (ref 11.1–15.9)
Immature Grans (Abs): 0 10*3/uL (ref 0.0–0.1)
Immature Granulocytes: 0 %
Lymphocytes Absolute: 3.1 10*3/uL (ref 0.7–3.1)
Lymphs: 42 %
MCH: 28 pg (ref 26.6–33.0)
MCHC: 31.7 g/dL (ref 31.5–35.7)
MCV: 88 fL (ref 79–97)
Monocytes Absolute: 0.5 10*3/uL (ref 0.1–0.9)
Monocytes: 7 %
Neutrophils Absolute: 3.5 10*3/uL (ref 1.4–7.0)
Neutrophils: 47 %
Platelets: 249 10*3/uL (ref 150–450)
RBC: 4.4 x10E6/uL (ref 3.77–5.28)
RDW: 11.8 % (ref 11.7–15.4)
WBC: 7.4 10*3/uL (ref 3.4–10.8)

## 2023-12-19 LAB — IRON,TIBC AND FERRITIN PANEL
Ferritin: 28 ng/mL (ref 15–150)
Iron Saturation: 19 % (ref 15–55)
Iron: 71 ug/dL (ref 27–159)
Total Iron Binding Capacity: 377 ug/dL (ref 250–450)
UIBC: 306 ug/dL (ref 131–425)

## 2023-12-19 LAB — LIPID PANEL
Chol/HDL Ratio: 3.2 ratio (ref 0.0–4.4)
Cholesterol, Total: 206 mg/dL — ABNORMAL HIGH (ref 100–199)
HDL: 64 mg/dL (ref >39)
LDL Chol Calc (NIH): 120 mg/dL — ABNORMAL HIGH (ref 0–99)
Triglycerides: 127 mg/dL (ref 0–149)
VLDL Cholesterol Cal: 22 mg/dL (ref 5–40)

## 2023-12-19 LAB — TSH: TSH: 1.91 u[IU]/mL (ref 0.450–4.500)

## 2023-12-19 LAB — HEMOGLOBIN A1C
Est. average glucose Bld gHb Est-mCnc: 100 mg/dL
Hgb A1c MFr Bld: 5.1 % (ref 4.8–5.6)

## 2023-12-19 LAB — VITAMIN B12: Vitamin B-12: 405 pg/mL (ref 232–1245)

## 2023-12-21 ENCOUNTER — Encounter: Payer: Self-pay | Admitting: Family

## 2024-01-01 ENCOUNTER — Encounter: Payer: Self-pay | Admitting: Family

## 2024-01-01 ENCOUNTER — Ambulatory Visit: Admitting: Family

## 2024-01-01 VITALS — BP 112/78 | HR 59 | Ht 62.0 in | Wt 138.0 lb

## 2024-01-01 DIAGNOSIS — Z0130 Encounter for examination of blood pressure without abnormal findings: Secondary | ICD-10-CM

## 2024-01-01 DIAGNOSIS — E559 Vitamin D deficiency, unspecified: Secondary | ICD-10-CM

## 2024-01-01 DIAGNOSIS — R5383 Other fatigue: Secondary | ICD-10-CM | POA: Diagnosis not present

## 2024-01-01 DIAGNOSIS — F411 Generalized anxiety disorder: Secondary | ICD-10-CM

## 2024-01-01 DIAGNOSIS — R110 Nausea: Secondary | ICD-10-CM | POA: Diagnosis not present

## 2024-01-01 MED ORDER — VITAMIN D (ERGOCALCIFEROL) 1.25 MG (50000 UNIT) PO CAPS: 50000.0000 [IU] | ORAL_CAPSULE | ORAL | 1 refills | Status: AC

## 2024-01-01 NOTE — Assessment & Plan Note (Signed)
 Patient given samples of Voquezna in office today.  Will try these until she comes back and let me know at return visit if they are working or not.   Reassess at follow up.

## 2024-01-01 NOTE — Progress Notes (Signed)
 Established Patient Office Visit  Subjective:  Patient ID: Diane Morse, female    DOB: Jan 31, 1996  Age: 28 y.o. MRN: [id number]  Chief Complaint  Patient presents with   Follow-up    2 week follow up    Pt. Here today for 2 week n/p follow up.   Had labs done at that time, so we will review in detail today.   Labs: Vitamin D  very low, LDL cholesterol elevated.   She is seeing some improvement with her anxiety with the lexapro , but reports that she is still having significant amounts of nausea on a regular basis.   No other concerns today.      No other concerns at this time.   Past Medical History:  Diagnosis Date   Medical history non-contributory    Pre-eclampsia 02/16/2021   Uterine size date discrepancy pregnancy, third trimester 08/05/2022    Past Surgical History:  Procedure Laterality Date   NO PAST SURGERIES      Social History   Socioeconomic History   Marital status: Married    Spouse name: Not on file   Number of children: Not on file   Years of education: Not on file   Highest education level: Not on file  Occupational History   Not on file  Tobacco Use   Smoking status: Never   Smokeless tobacco: Never  Vaping Use   Vaping status: Never Used  Substance and Sexual Activity   Alcohol use: No   Drug use: No   Sexual activity: Not on file  Other Topics Concern   Not on file  Social History Narrative   Not on file   Social Drivers of Health   Financial Resource Strain: Not on file  Food Insecurity: Not on file  Transportation Needs: Not on file  Physical Activity: Not on file  Stress: Not on file  Social Connections: Not on file  Intimate Partner Violence: Not on file    No family history on file.  No Known Allergies  Review of Systems  Gastrointestinal:  Positive for nausea.  All other systems reviewed and are negative.      Objective:   BP 112/78   Pulse (!) 59   Ht 5\' 2"  (1.575 m)   Wt 138 lb (62.6 kg)   LMP  11/26/2023   SpO2 98%   BMI 25.24 kg/m   Vitals:   01/01/24 1510  BP: 112/78  Pulse: (!) 59  Height: 5\' 2"  (1.575 m)  Weight: 138 lb (62.6 kg)  SpO2: 98%  BMI (Calculated): 25.23    Physical Exam Vitals and nursing note reviewed.  Constitutional:      Appearance: Normal appearance. She is normal weight.  HENT:     Head: Normocephalic and atraumatic.     Right Ear: Tympanic membrane, ear canal and external ear normal.     Left Ear: Tympanic membrane, ear canal and external ear normal.     Nose: Nose normal.  Eyes:     Extraocular Movements: Extraocular movements intact.     Conjunctiva/sclera: Conjunctivae normal.     Pupils: Pupils are equal, round, and reactive to light.  Cardiovascular:     Rate and Rhythm: Normal rate.  Pulmonary:     Effort: Pulmonary effort is normal.  Musculoskeletal:        General: Normal range of motion.     Cervical back: Normal range of motion.  Neurological:     General: No focal deficit present.  Mental Status: She is alert and oriented to person, place, and time. Mental status is at baseline.  Psychiatric:        Mood and Affect: Mood normal.        Behavior: Behavior normal.        Thought Content: Thought content normal.      No results found for any visits on 01/01/24.  Recent Results (from the past 2160 hours)  POCT Urinalysis Dipstick     Status: None   Collection Time: 12/18/23 11:23 AM  Result Value Ref Range   Color, UA YELLOW    Clarity, UA CLEAR    Glucose, UA Negative Negative   Bilirubin, UA 1+    Ketones, UA negative    Spec Grav, UA 1.025 1.010 - 1.025   Blood, UA negative    pH, UA 6.0 5.0 - 8.0   Protein, UA Negative Negative   Urobilinogen, UA 0.2 0.2 or 1.0 E.U./dL   Nitrite, UA negative    Leukocytes, UA Negative Negative   Appearance clear    Odor yes   Lipid panel     Status: Abnormal   Collection Time: 12/18/23 11:25 AM  Result Value Ref Range   Cholesterol, Total 206 (H) 100 - 199 mg/dL    Triglycerides 161 0 - 149 mg/dL   HDL 64 >09 mg/dL   VLDL Cholesterol Cal 22 5 - 40 mg/dL   LDL Chol Calc (NIH) 604 (H) 0 - 99 mg/dL   Chol/HDL Ratio 3.2 0.0 - 4.4 ratio    Comment:                                   T. Chol/HDL Ratio                                             Men  Women                               1/2 Avg.Risk  3.4    3.3                                   Avg.Risk  5.0    4.4                                2X Avg.Risk  9.6    7.1                                3X Avg.Risk 23.4   11.0   CMP14+EGFR     Status: None   Collection Time: 12/18/23 11:25 AM  Result Value Ref Range   Glucose 86 70 - 99 mg/dL   BUN 11 6 - 20 mg/dL   Creatinine, Ser 5.40 0.57 - 1.00 mg/dL   eGFR 981 >19 JY/NWG/9.56   BUN/Creatinine Ratio 14 9 - 23   Sodium 139 134 - 144 mmol/L   Potassium 4.0 3.5 - 5.2 mmol/L   Chloride 104 96 - 106 mmol/L   CO2 22 20 - 29 mmol/L  Calcium  9.4 8.7 - 10.2 mg/dL   Total Protein 6.8 6.0 - 8.5 g/dL   Albumin 4.6 4.0 - 5.0 g/dL   Globulin, Total 2.2 1.5 - 4.5 g/dL   Bilirubin Total 0.4 0.0 - 1.2 mg/dL   Alkaline Phosphatase 77 44 - 121 IU/L   AST 24 0 - 40 IU/L   ALT 26 0 - 32 IU/L  TSH     Status: None   Collection Time: 12/18/23 11:25 AM  Result Value Ref Range   TSH 1.910 0.450 - 4.500 uIU/mL  Hemoglobin A1c     Status: None   Collection Time: 12/18/23 11:25 AM  Result Value Ref Range   Hgb A1c MFr Bld 5.1 4.8 - 5.6 %    Comment:          Prediabetes: 5.7 - 6.4          Diabetes: >6.4          Glycemic control for adults with diabetes: <7.0    Est. average glucose Bld gHb Est-mCnc 100 mg/dL  Vitamin U44     Status: None   Collection Time: 12/18/23 11:25 AM  Result Value Ref Range   Vitamin B-12 405 232 - 1,245 pg/mL  CBC with Diff     Status: None   Collection Time: 12/18/23 11:25 AM  Result Value Ref Range   WBC 7.4 3.4 - 10.8 x10E3/uL   RBC 4.40 3.77 - 5.28 x10E6/uL   Hemoglobin 12.3 11.1 - 15.9 g/dL   Hematocrit 03.4 74.2 - 46.6 %    MCV 88 79 - 97 fL   MCH 28.0 26.6 - 33.0 pg   MCHC 31.7 31.5 - 35.7 g/dL   RDW 59.5 63.8 - 75.6 %   Platelets 249 150 - 450 x10E3/uL   Neutrophils 47 Not Estab. %   Lymphs 42 Not Estab. %   Monocytes 7 Not Estab. %   Eos 3 Not Estab. %   Basos 1 Not Estab. %   Neutrophils Absolute 3.5 1.4 - 7.0 x10E3/uL   Lymphocytes Absolute 3.1 0.7 - 3.1 x10E3/uL   Monocytes Absolute 0.5 0.1 - 0.9 x10E3/uL   EOS (ABSOLUTE) 0.2 0.0 - 0.4 x10E3/uL   Basophils Absolute 0.1 0.0 - 0.2 x10E3/uL   Immature Granulocytes 0 Not Estab. %   Immature Grans (Abs) 0.0 0.0 - 0.1 x10E3/uL  Vitamin D  (25 hydroxy)     Status: Abnormal   Collection Time: 12/18/23 11:25 AM  Result Value Ref Range   Vit D, 25-Hydroxy 17.2 (L) 30.0 - 100.0 ng/mL    Comment: Vitamin D  deficiency has been defined by the Institute of Medicine and an Endocrine Society practice guideline as a level of serum 25-OH vitamin D  less than 20 ng/mL (1,2). The Endocrine Society went on to further define vitamin D  insufficiency as a level between 21 and 29 ng/mL (2). 1. IOM (Institute of Medicine). 2010. Dietary reference    intakes for calcium  and D. [place]: The    [employer]. 2. Holick MF, Binkley [place], Bischoff-Ferrari HA, et al.    Evaluation, treatment, and prevention of vitamin D     deficiency: an Endocrine Society clinical practice    guideline. JCEM. 2011 Jul; 96(7):1911-30.   Iron, TIBC and Ferritin Panel     Status: None   Collection Time: 12/18/23 11:25 AM  Result Value Ref Range   Total Iron Binding Capacity 377 250 - 450 ug/dL   UIBC 433 295 - 188 ug/dL   Iron  71 27 - 159 ug/dL   Iron Saturation 19 15 - 55 %   Ferritin 28 15 - 150 ng/mL       Assessment & Plan:   Problem List Items Addressed This Visit       Other   Vitamin D  deficiency, unspecified   Vitamin D  was <20 Patient counseled that this could be affecting his energy level and bone health.   Sending supplementation for pt.  Will recheck  at follow up.        Nausea   Patient given samples of Voquezna in office today.  Will try these until she comes back and let me know at return visit if they are working or not.   Reassess at follow up.       Generalized anxiety disorder   Patient is improving with current treatment.  Will reassess again at follow up appointment in 2 weeks.      Other Visit Diagnoses       Other fatigue    -  Primary       Return in about 2 weeks (around 01/15/2024) for F/U.   Total time spent: 20 minutes  Trenda Frisk, FNP  01/01/2024   This document may have been prepared by [facility] Voice Recognition software and as such may include unintentional dictation errors.

## 2024-01-01 NOTE — Assessment & Plan Note (Signed)
 Patient is improving with current treatment.  Will reassess again at follow up appointment in 2 weeks.

## 2024-01-01 NOTE — Assessment & Plan Note (Signed)
 Vitamin D was <20 Patient counseled that this could be affecting his energy level and bone health.   Sending supplementation for pt.  Will recheck at follow up.

## 2024-01-02 DIAGNOSIS — Z8759 Personal history of other complications of pregnancy, childbirth and the puerperium: Secondary | ICD-10-CM | POA: Insufficient documentation

## 2024-01-14 ENCOUNTER — Ambulatory Visit: Admitting: Family

## 2024-01-21 ENCOUNTER — Ambulatory Visit: Admitting: Family

## 2024-02-12 ENCOUNTER — Ambulatory Visit: Admitting: Family Medicine

## 2024-03-04 ENCOUNTER — Other Ambulatory Visit: Payer: Self-pay | Admitting: Family

## 2024-03-15 ENCOUNTER — Encounter: Payer: Self-pay | Admitting: Family

## 2024-03-15 NOTE — Progress Notes (Signed)
 New Patient Office Visit  Subjective  Patient ID: Diane Morse, female    DOB: 1996/01/01  Age: 28 y.o. MRN: [id number]  CC:  Chief Complaint  Patient presents with   Establish Care    NPE      HPI Diane Morse presents to establish care  she does have additional concerns to discuss today.   Outpatient Encounter Medications as of 12/18/2023  Medication Sig   [DISCONTINUED] escitalopram  (LEXAPRO ) 5 MG tablet Take 1 tablet (5 mg total) by mouth daily.   [DISCONTINUED] ondansetron  (ZOFRAN -ODT) 4 MG disintegrating tablet Take 1 tablet (4 mg total) by mouth every 8 (eight) hours as needed for nausea or vomiting. (Patient not taking: Reported on 01/01/2024)   [DISCONTINUED] ondansetron  (ZOFRAN -ODT) 4 MG disintegrating tablet Take 1 tablet (4 mg total) by mouth every 8 (eight) hours as needed. (Patient not taking: Reported on 01/01/2024)   [DISCONTINUED] acetaminophen  (TYLENOL ) 325 MG tablet Take 2 tablets (650 mg total) by mouth every 4 (four) hours as needed (for pain scale < 4). (Patient not taking: Reported on 01/01/2024)   [DISCONTINUED] aspirin EC 81 MG tablet Take by mouth. (Patient not taking: Reported on 01/01/2024)   [DISCONTINUED] benzocaine -Menthol  (DERMOPLAST) 20-0.5 % AERO Apply 1 application topically as needed for irritation (perineal discomfort). (Patient not taking: Reported on 01/01/2024)   [DISCONTINUED] Blood Pressure Monitoring (SELF-TAKING BLOOD PRESSURE) KIT To monitor blood pressure at home during pregnancy. (Patient not taking: Reported on 01/01/2024)   [DISCONTINUED] cephALEXin  (KEFLEX ) 500 MG capsule Take 1 capsule (500 mg total) by mouth 2 (two) times daily. (Patient not taking: Reported on 01/01/2024)   [DISCONTINUED] coconut oil OIL Apply 1 application topically as needed. (Patient not taking: Reported on 01/01/2024)   [DISCONTINUED] dicyclomine  (BENTYL ) 10 MG capsule Take 1 capsule (10 mg total) by mouth 3 (three) times daily as needed for spasms. (Patient not  taking: Reported on 01/01/2024)   [DISCONTINUED] Doxylamine-Pyridoxine 10-10 MG TBEC 2 Tablets ay night.  Add 1 tablet on day 3 in the morning if symptoms continue.  If still symptomatic on day 4 add 1 tablet in the afternoon. (Patient not taking: Reported on 01/01/2024)   [DISCONTINUED] ferrous sulfate  325 (65 FE) MG tablet Take 1 tablet (325 mg total) by mouth 2 (two) times daily with a meal. (Patient not taking: Reported on 01/01/2024)   [DISCONTINUED] hydrocortisone  cream 1 % Apply topically 3 (three) times daily. (Patient not taking: Reported on 01/01/2024)   [DISCONTINUED] ibuprofen  (ADVIL ) 600 MG tablet Take 1 tablet (600 mg total) by mouth every 6 (six) hours. (Patient not taking: Reported on 01/01/2024)   [DISCONTINUED] labetalol  (NORMODYNE ) 200 MG tablet Take 1 tablet (200 mg total) by mouth 2 (two) times daily. (Patient not taking: Reported on 01/01/2024)   [DISCONTINUED] meclizine  (ANTIVERT ) 12.5 MG tablet Take 1 or 2 tablets three times daily for dizziness and nausea. (Patient not taking: Reported on 01/01/2024)   [DISCONTINUED] NIFEdipine  (ADALAT  CC) 30 MG 24 hr tablet Take 1 tablet by mouth daily. (Patient not taking: Reported on 01/01/2024)   [DISCONTINUED] oxyCODONE  (ROXICODONE ) 5 MG immediate release tablet Take 1 tablet (5 mg total) by mouth every 8 (eight) hours as needed. (Patient not taking: Reported on 01/01/2024)   [DISCONTINUED] predniSONE  (STERAPRED UNI-PAK 21 TAB) 10 MG (21) TBPK tablet Take by mouth daily. Take 6 tabs by mouth daily for 1 day, then 5 tabs for 1 day, then 4 tabs for 1 day, then 3 tabs for 1 day, then 2 tabs for 1  day, then 1 tab by mouth daily for 1 day (Patient not taking: Reported on 01/01/2024)   [DISCONTINUED] promethazine (PHENERGAN) 25 MG tablet Take by mouth. (Patient not taking: Reported on 01/01/2024)   [DISCONTINUED] senna-docusate (SENOKOT-S) 8.6-50 MG tablet Take 2 tablets by mouth daily. (Patient not taking: Reported on 01/01/2024)   [DISCONTINUED] simethicone   (MYLICON) 80 MG chewable tablet Chew 1 tablet (80 mg total) by mouth as needed for flatulence. (Patient not taking: Reported on 01/01/2024)   [DISCONTINUED] witch hazel-glycerin  (TUCKS) pad Apply 1 application topically as needed for hemorrhoids. (Patient not taking: Reported on 01/01/2024)   No facility-administered encounter medications on file as of 12/18/2023.    Past Medical History:  Diagnosis Date   Medical history non-contributory    Pre-eclampsia 02/16/2021   Uterine size date discrepancy pregnancy, third trimester 08/05/2022    Past Surgical History:  Procedure Laterality Date   NO PAST SURGERIES      No family history on file.  Social History   Socioeconomic History   Marital status: Married    Spouse name: Not on file   Number of children: Not on file   Years of education: Not on file   Highest education level: Not on file  Occupational History   Not on file  Tobacco Use   Smoking status: Never   Smokeless tobacco: Never  Vaping Use   Vaping status: Never Used  Substance and Sexual Activity   Alcohol use: No   Drug use: No   Sexual activity: Not on file  Other Topics Concern   Not on file  Social History Narrative   Not on file   Social Drivers of Health   Financial Resource Strain: Low Risk  (01/02/2024)   Received from [facility]   Overall Financial Resource Strain (CARDIA)    Difficulty of Paying Living Expenses: Not hard at all  Food Insecurity: No Food Insecurity (01/02/2024)   Received from [facility]   Hunger Vital Sign    Within the past 12 months, you worried that your food would run out before you got the money to buy more.: Never true    Within the past 12 months, the food you bought just didn't last and you didn't have money to get more.: Never true  Transportation Needs: No Transportation Needs (01/02/2024)   Received from [facility] - Transportation    In the past 12 months,  has lack of transportation kept you from medical appointments or from getting medications?: No    Lack of Transportation (Non-Medical): No  Physical Activity: Not on file  Stress: Not on file  Social Connections: Not on file  Intimate Partner Violence: Not on file    Review of Systems  Cardiovascular:  Positive for chest pain.  Genitourinary:  Positive for dysuria.  Psychiatric/Behavioral:  The patient is nervous/anxious.   All other systems reviewed and are negative.       Objective   BP 120/73   Pulse 61   Ht 5' 2 (1.575 m)   Wt 138 lb (62.6 kg)   LMP 11/26/2023   SpO2 99%   BMI 25.24 kg/m   Physical Exam Vitals and nursing note reviewed.  Constitutional:      Appearance: Normal appearance. She is normal weight.  HENT:     Head: Normocephalic.  Eyes:     Extraocular Movements: Extraocular movements intact.     Conjunctiva/sclera: Conjunctivae normal.     Pupils:  Pupils are equal, round, and reactive to light.  Cardiovascular:     Rate and Rhythm: Normal rate.  Pulmonary:     Effort: Pulmonary effort is normal.  Neurological:     General: No focal deficit present.     Mental Status: She is alert and oriented to person, place, and time. Mental status is at baseline.  Psychiatric:        Mood and Affect: Mood normal.        Behavior: Behavior normal.        Thought Content: Thought content normal.       Assessment & Plan B12 deficiency due to diet Vitamin D  deficiency, unspecified Other fatigue Checking labs today.  Will continue supplements as needed.   - Vitamin D  - Vitamin B12 - TSH  Essential hypertension, benign Blood pressure well controlled with current medications.  Continue current therapy.  Will reassess at follow up.   - CBC w/Diff - CMP w/eGFR  Mixed hyperlipidemia Checking labs today.  Continue current therapy for lipid control. Will modify as needed based on labwork results.   -CMP w/eGFR -Lipid Panel  Prediabetes A1C  Continues to be in prediabetic ranges.  Will reassess at follow up after next lab check.  Patient counseled on dietary choices and verbalized understanding.   -CBC w/Diff -CMP w/eGFR -Hemoglobin A1C  Screening for HIV without presence of risk factors Need for hepatitis C screening test Test ordered today.  Will call patient with results when available.  Chest pain, unspecified type EKG done in office today - NSR.  Suspect cause of chest pains is either anxiety or reflux related.  Will reassess at follow up.  Pelvic pain UA in office today WNL.  Will reassess at follow up as needed.  Return in about 2 weeks (around 01/01/2024) for F/U.   Total time spent: 30 minutes  ALAN CHRISTELLA ARRANT, FNP  12/18/2023  This document may have been prepared by [facility] Voice Recognition software and as such may include unintentional dictation errors.

## 2024-03-15 NOTE — Progress Notes (Signed)
Sleep Questionnaire: 2/10

## 2024-03-15 NOTE — Assessment & Plan Note (Signed)
 Checking labs today.  Will continue supplements as needed.   - Vitamin D  - Vitamin B12 - TSH

## 2024-08-22 ENCOUNTER — Observation Stay
Admission: EM | Admit: 2024-08-22 | Discharge: 2024-08-23 | Disposition: A | Discharge status: Home / Self Care | Attending: Certified Nurse Midwife | Admitting: Obstetrics and Gynecology

## 2024-08-22 ENCOUNTER — Encounter: Payer: Self-pay | Admitting: *Deleted

## 2024-08-22 ENCOUNTER — Other Ambulatory Visit: Payer: Self-pay

## 2024-08-22 DIAGNOSIS — Z8759 Personal history of other complications of pregnancy, childbirth and the puerperium: Secondary | ICD-10-CM | POA: Insufficient documentation

## 2024-08-22 DIAGNOSIS — O09893 Supervision of other high risk pregnancies, third trimester: Secondary | ICD-10-CM | POA: Insufficient documentation

## 2024-08-22 DIAGNOSIS — Z3A23 23 weeks gestation of pregnancy: Secondary | ICD-10-CM | POA: Insufficient documentation

## 2024-08-22 DIAGNOSIS — O99342 Other mental disorders complicating pregnancy, second trimester: Secondary | ICD-10-CM | POA: Insufficient documentation

## 2024-08-22 DIAGNOSIS — O99893 Other specified diseases and conditions complicating puerperium: Principal | ICD-10-CM | POA: Insufficient documentation

## 2024-08-22 NOTE — OB Triage Note (Signed)
 Pt is G2P1 at 23.5w with c/o severe dizziness x 1 hr. Last ate approx 3 hrs ago, food from home. No vomiting does feel nauseated. Has hx of migraines and was going to see neurologist but wasn't able to keep appointment. Hx of PreE with 1st pregnancy and induced at 34 weeks. FHR 145. No ob related c/o

## 2024-08-22 NOTE — ED Triage Notes (Signed)
 Pt reports onset of nausea and dizziness around 45 minutes ago. Lower mid abdominal pain for about a week, no bleeding or discharge. G2, P1. Last zofran  1 hour ago without improvement

## 2024-08-23 DIAGNOSIS — R42 Dizziness and giddiness: Secondary | ICD-10-CM

## 2024-08-23 MED ORDER — BUTALBITAL-APAP-CAFFEINE 50-325-40 MG PO TABS
2.0000 | ORAL_TABLET | Freq: Once | ORAL | Status: AC
  Administered 2024-08-23: 2 via ORAL
  Filled 2024-08-23: qty 2

## 2024-08-23 NOTE — OB Triage Note (Signed)
 C/o round ligament pain x a couple weeks

## 2024-08-23 NOTE — Discharge Summary (Signed)
 Patient ID: Shauntae Reitman MRN: [id number] DOB/AGE: [birth year] 29 y.o.  Admit date: 08/22/2024 Discharge date: 08/23/2024  Admission Diagnoses: 28 y.o. G2P0101 at [redacted]w[redacted]d present dizziness and nausea   Discharge Diagnoses:  Orthostatic BPs WNL Fetal Wellbeing: doppler normal for GA D/c home stable, precautions reviewed, follow-up as scheduled.   Factors complicating pregnancy: History of Preeclampsia w/ SF  Anxiety w/ hx of PPD  Rubella and Varicella non-immune   Prenatal Procedures: none  Consults: None  Significant Diagnostic Studies:  No results found for this or any previous visit (from the past week).  Treatments: analgesia: Fioricet    Hospital Course:  This is a 28 y.o. G2P0101 with IUP at [redacted]w[redacted]d seen for dizziness and nausea.  She was recently referred to neurology for migraines so she was given Fioricet  for a possible migraine, which did not resolve symptoms.She was observed, fetal heart rate monitoring was reassuring, and she had no signs/symptoms of labor or other maternal-fetal concerns.  She was deemed stable for discharge to home with outpatient follow up.  Discharge Physical Exam:  BP 110/73   Pulse 82   Temp 98.4 F (36.9 C) (Oral)   Resp 16   LMP 11/26/2023   SpO2 99%   General: NAD CV: RRR Pulm: nl effort ABD: s/nd/nt, gravid DVT Evaluation: LE non-ttp, no evidence of DVT on exam.  NST: Appropriate for GA   TOCO: quiet SVE: deferred      Discharge Condition: Stable  Disposition: Discharge disposition: 01-Home or Self Care        Allergies as of 08/23/2024   No Known Allergies      Medication List     TAKE these medications    escitalopram  5 MG tablet Commonly known as: LEXAPRO  TAKE 1 TABLET BY MOUTH DAILY   Vitamin D  (Ergocalciferol ) 1.25 MG (50000 UNIT) Caps capsule Commonly known as: DRISDOL  Take 1 capsule (50,000 Units total) by mouth every 7 (seven) days.        Follow-up Information     [facility]  Follow up.   Why: Keep all scheduled appointments Contact information: [address] [place]  [address] [phone]                Signed:  DELON COE, CNM 08/23/2024 2:13 AM

## 2024-08-23 NOTE — OB Triage Note (Signed)
 Pt feels comfortable with going home. CNM states that pt can call office on Monday for outpt follow up as needed
